# Patient Record
Sex: Female | Born: 1980
Health system: Southern US, Community
[De-identification: ages and names within clinical notes are randomized; demographics above are authoritative.]

## PROBLEM LIST (undated history)

## (undated) DIAGNOSIS — Z8619 Personal history of other infectious and parasitic diseases: Secondary | ICD-10-CM

## (undated) DIAGNOSIS — R87629 Unspecified abnormal cytological findings in specimens from vagina: Secondary | ICD-10-CM

## (undated) DIAGNOSIS — D649 Anemia, unspecified: Secondary | ICD-10-CM

## (undated) DIAGNOSIS — E559 Vitamin D deficiency, unspecified: Secondary | ICD-10-CM

## (undated) DIAGNOSIS — D509 Iron deficiency anemia, unspecified: Secondary | ICD-10-CM

## (undated) DIAGNOSIS — Z973 Presence of spectacles and contact lenses: Secondary | ICD-10-CM

## (undated) DIAGNOSIS — E538 Deficiency of other specified B group vitamins: Secondary | ICD-10-CM

## (undated) DIAGNOSIS — D219 Benign neoplasm of connective and other soft tissue, unspecified: Secondary | ICD-10-CM

---

## 2002-03-11 ENCOUNTER — Ambulatory Visit: Admission: RE | Admit: 2002-03-11 | Discharge: 2002-04-10 | Payer: Self-pay | Admitting: *Deleted

## 2005-08-27 ENCOUNTER — Other Ambulatory Visit: Admission: RE | Admit: 2005-08-27 | Discharge: 2005-08-27 | Payer: Self-pay | Admitting: Obstetrics and Gynecology

## 2011-08-06 ENCOUNTER — Ambulatory Visit (INDEPENDENT_AMBULATORY_CARE_PROVIDER_SITE_OTHER): Payer: BC Managed Care – PPO | Admitting: Obstetrics and Gynecology

## 2011-08-06 DIAGNOSIS — Z01419 Encounter for gynecological examination (general) (routine) without abnormal findings: Secondary | ICD-10-CM

## 2013-08-13 ENCOUNTER — Other Ambulatory Visit: Payer: Self-pay | Admitting: Obstetrics and Gynecology

## 2013-08-13 DIAGNOSIS — D219 Benign neoplasm of connective and other soft tissue, unspecified: Secondary | ICD-10-CM

## 2013-08-13 DIAGNOSIS — N941 Unspecified dyspareunia: Secondary | ICD-10-CM

## 2013-08-18 ENCOUNTER — Other Ambulatory Visit: Payer: Self-pay

## 2013-09-29 ENCOUNTER — Other Ambulatory Visit: Payer: Self-pay

## 2013-11-22 ENCOUNTER — Encounter (HOSPITAL_COMMUNITY): Payer: Self-pay | Admitting: Pharmacist

## 2013-11-23 ENCOUNTER — Other Ambulatory Visit: Payer: Self-pay | Admitting: Obstetrics and Gynecology

## 2013-11-29 NOTE — Patient Instructions (Addendum)
   Your procedure is scheduled on:  Friday, June 26  Enter through the [employer] of [facility] at: 9 AM Pick up the phone at the desk and dial [phone] and inform us of your arrival.  Please call this number if you have any problems the morning of surgery: [phone]  Remember: Do not eat or drink after midnight: Thursday: Take these medicines the morning of surgery with a SIP OF WATER:  None  Do not wear jewelry, make-up, or FINGER nail polish No metal in your hair or on your body. Do not wear lotions, powders, perfumes.  You may wear deodorant.  Do not bring valuables to the hospital. Contacts, dentures or bridgework may not be worn into surgery.  Leave suitcase in the car. After Surgery it may be brought to your room. For patients being admitted to the hospital, checkout time is 11:00am the day of discharge.  Home with husband Quillian Quince cell [phone].

## 2013-11-30 ENCOUNTER — Encounter (HOSPITAL_COMMUNITY): Payer: Self-pay

## 2013-11-30 ENCOUNTER — Encounter (HOSPITAL_COMMUNITY)
Admission: RE | Admit: 2013-11-30 | Discharge: 2013-11-30 | Disposition: A | Discharge status: Home / Self Care | Payer: Federal, State, Local not specified - PPO | Source: Ambulatory Visit | Attending: Obstetrics and Gynecology | Admitting: Obstetrics and Gynecology

## 2013-11-30 LAB — TYPE AND SCREEN
ABO/RH(D): O POS
Antibody Screen: NEGATIVE

## 2013-11-30 LAB — CBC
HEMATOCRIT: 40.3 % (ref 36.0–46.0)
Hemoglobin: 13.3 g/dL (ref 12.0–15.0)
MCH: 27.4 pg (ref 26.0–34.0)
MCHC: 33 g/dL (ref 30.0–36.0)
MCV: 83.1 fL (ref 78.0–100.0)
PLATELETS: 258 10*3/uL (ref 150–400)
RBC: 4.85 MIL/uL (ref 3.87–5.11)
RDW: 13.5 % (ref 11.5–15.5)
WBC: 8.9 10*3/uL (ref 4.0–10.5)

## 2013-11-30 LAB — ABO/RH: ABO/RH(D): O POS

## 2013-12-03 ENCOUNTER — Encounter (HOSPITAL_COMMUNITY): Payer: Self-pay | Admitting: *Deleted

## 2013-12-03 ENCOUNTER — Encounter (HOSPITAL_COMMUNITY)
Admission: RE | Disposition: A | Discharge status: Home / Self Care | Payer: Self-pay | Source: Ambulatory Visit | Attending: Obstetrics and Gynecology

## 2013-12-03 ENCOUNTER — Encounter (HOSPITAL_COMMUNITY): Payer: Federal, State, Local not specified - PPO | Admitting: Anesthesiology

## 2013-12-03 ENCOUNTER — Inpatient Hospital Stay (HOSPITAL_COMMUNITY): Payer: Federal, State, Local not specified - PPO | Admitting: Anesthesiology

## 2013-12-03 ENCOUNTER — Inpatient Hospital Stay (HOSPITAL_COMMUNITY)
Admission: RE | Admit: 2013-12-03 | Discharge: 2013-12-05 | DRG: 742 | Disposition: A | Discharge status: Home / Self Care | Payer: Federal, State, Local not specified - PPO | Source: Ambulatory Visit | Attending: Obstetrics and Gynecology | Admitting: Obstetrics and Gynecology

## 2013-12-03 DIAGNOSIS — D250 Submucous leiomyoma of uterus: Principal | ICD-10-CM | POA: Diagnosis present

## 2013-12-03 DIAGNOSIS — IMO0002 Reserved for concepts with insufficient information to code with codable children: Secondary | ICD-10-CM | POA: Diagnosis present

## 2013-12-03 DIAGNOSIS — N831 Corpus luteum cyst of ovary, unspecified side: Secondary | ICD-10-CM | POA: Diagnosis present

## 2013-12-03 DIAGNOSIS — D279 Benign neoplasm of unspecified ovary: Secondary | ICD-10-CM | POA: Diagnosis present

## 2013-12-03 DIAGNOSIS — D251 Intramural leiomyoma of uterus: Secondary | ICD-10-CM | POA: Diagnosis present

## 2013-12-03 DIAGNOSIS — Z9889 Other specified postprocedural states: Secondary | ICD-10-CM

## 2013-12-03 DIAGNOSIS — Y9210 Unspecified residential institution as the place of occurrence of the external cause: Secondary | ICD-10-CM | POA: Diagnosis not present

## 2013-12-03 DIAGNOSIS — Y838 Other surgical procedures as the cause of abnormal reaction of the patient, or of later complication, without mention of misadventure at the time of the procedure: Secondary | ICD-10-CM | POA: Diagnosis not present

## 2013-12-03 HISTORY — PX: MYOMECTOMY: SHX85

## 2013-12-03 LAB — PREGNANCY, URINE: Preg Test, Ur: NEGATIVE

## 2013-12-03 SURGERY — MYOMECTOMY, ABDOMINAL APPROACH
Anesthesia: General | Site: Abdomen

## 2013-12-03 MED ORDER — KETOROLAC TROMETHAMINE 30 MG/ML IJ SOLN
30.0000 mg | Freq: Four times a day (QID) | INTRAMUSCULAR | Status: DC
  Administered 2013-12-03 – 2013-12-04 (×2): 30 mg via INTRAVENOUS
  Filled 2013-12-03 (×2): qty 1

## 2013-12-03 MED ORDER — PROPOFOL 10 MG/ML IV BOLUS
INTRAVENOUS | Status: DC | PRN
  Administered 2013-12-03: 200 mg via INTRAVENOUS

## 2013-12-03 MED ORDER — PROMETHAZINE HCL 25 MG/ML IJ SOLN: 6.2500 mg | INTRAMUSCULAR | Status: DC | PRN

## 2013-12-03 MED ORDER — GLYCOPYRROLATE 0.2 MG/ML IJ SOLN
INTRAMUSCULAR | Status: DC | PRN
  Administered 2013-12-03: 0.1 mg via INTRAVENOUS
  Administered 2013-12-03: 0.4 mg via INTRAVENOUS
  Administered 2013-12-03: 0.1 mg via INTRAVENOUS

## 2013-12-03 MED ORDER — MIDAZOLAM HCL 2 MG/2ML IJ SOLN
INTRAMUSCULAR | Status: DC | PRN
  Administered 2013-12-03: 2 mg via INTRAVENOUS

## 2013-12-03 MED ORDER — NALOXONE HCL 0.4 MG/ML IJ SOLN: 0.4000 mg | INTRAMUSCULAR | Status: DC | PRN

## 2013-12-03 MED ORDER — ROCURONIUM BROMIDE 100 MG/10ML IV SOLN
INTRAVENOUS | Status: AC
  Filled 2013-12-03: qty 1

## 2013-12-03 MED ORDER — HYDROMORPHONE HCL PF 1 MG/ML IJ SOLN: 0.2000 mg | INTRAMUSCULAR | Status: DC | PRN

## 2013-12-03 MED ORDER — NEOSTIGMINE METHYLSULFATE 10 MG/10ML IV SOLN
INTRAVENOUS | Status: AC
  Filled 2013-12-03: qty 1

## 2013-12-03 MED ORDER — DEXTROSE IN LACTATED RINGERS 5 % IV SOLN
INTRAVENOUS | Status: DC
  Administered 2013-12-03: 19:00:00 via INTRAVENOUS

## 2013-12-03 MED ORDER — BUPIVACAINE HCL (PF) 0.25 % IJ SOLN
INTRAMUSCULAR | Status: DC | PRN
  Administered 2013-12-03: 8 mL

## 2013-12-03 MED ORDER — HYDROMORPHONE 0.3 MG/ML IV SOLN
INTRAVENOUS | Status: DC
  Administered 2013-12-03: 1.19 mg via INTRAVENOUS
  Administered 2013-12-03: 15:00:00 via INTRAVENOUS
  Administered 2013-12-03: 3.99 mg via INTRAVENOUS
  Administered 2013-12-04: 0.2 mg via INTRAVENOUS
  Administered 2013-12-04: 0.6 mg via INTRAVENOUS
  Filled 2013-12-03: qty 25

## 2013-12-03 MED ORDER — OXYCODONE-ACETAMINOPHEN 5-325 MG PO TABS
1.0000 | ORAL_TABLET | ORAL | Status: DC | PRN
  Administered 2013-12-04: 2 via ORAL
  Administered 2013-12-04: 1 via ORAL
  Administered 2013-12-05: 2 via ORAL
  Filled 2013-12-03 (×3): qty 2

## 2013-12-03 MED ORDER — NEOSTIGMINE METHYLSULFATE 10 MG/10ML IV SOLN
INTRAVENOUS | Status: DC | PRN
  Administered 2013-12-03: 3 mg via INTRAVENOUS

## 2013-12-03 MED ORDER — GLYCOPYRROLATE 0.2 MG/ML IJ SOLN
INTRAMUSCULAR | Status: AC
  Filled 2013-12-03: qty 3

## 2013-12-03 MED ORDER — FENTANYL CITRATE 0.05 MG/ML IJ SOLN
INTRAMUSCULAR | Status: AC
  Filled 2013-12-03: qty 2

## 2013-12-03 MED ORDER — HYDROMORPHONE HCL PF 1 MG/ML IJ SOLN
0.2500 mg | INTRAMUSCULAR | Status: DC | PRN
Start: 2013-12-03 — End: 2013-12-03
  Administered 2013-12-03: 0.5 mg via INTRAVENOUS

## 2013-12-03 MED ORDER — MENTHOL 3 MG MT LOZG: 1.0000 | LOZENGE | OROMUCOSAL | Status: DC | PRN

## 2013-12-03 MED ORDER — ONDANSETRON HCL 4 MG PO TABS
4.0000 mg | ORAL_TABLET | Freq: Four times a day (QID) | ORAL | Status: DC | PRN
  Administered 2013-12-05: 4 mg via ORAL
  Filled 2013-12-03: qty 1

## 2013-12-03 MED ORDER — KETOROLAC TROMETHAMINE 30 MG/ML IJ SOLN
INTRAMUSCULAR | Status: DC | PRN
  Administered 2013-12-03: 30 mg via INTRAVENOUS

## 2013-12-03 MED ORDER — PROPOFOL 10 MG/ML IV EMUL
INTRAVENOUS | Status: AC
  Filled 2013-12-03: qty 20

## 2013-12-03 MED ORDER — IBUPROFEN 800 MG PO TABS: 800.0000 mg | ORAL_TABLET | Freq: Three times a day (TID) | ORAL | Status: DC | PRN

## 2013-12-03 MED ORDER — DEXAMETHASONE SODIUM PHOSPHATE 10 MG/ML IJ SOLN
INTRAMUSCULAR | Status: DC | PRN
  Administered 2013-12-03: 10 mg via INTRAVENOUS

## 2013-12-03 MED ORDER — DIPHENHYDRAMINE HCL 50 MG/ML IJ SOLN: 12.5000 mg | Freq: Four times a day (QID) | INTRAMUSCULAR | Status: DC | PRN

## 2013-12-03 MED ORDER — ONDANSETRON HCL 4 MG/2ML IJ SOLN
INTRAMUSCULAR | Status: DC | PRN
  Administered 2013-12-03: 4 mg via INTRAVENOUS

## 2013-12-03 MED ORDER — METHYLENE BLUE 1 % INJ SOLN
INTRAMUSCULAR | Status: AC
  Filled 2013-12-03: qty 1

## 2013-12-03 MED ORDER — HYDROMORPHONE HCL PF 1 MG/ML IJ SOLN
INTRAMUSCULAR | Status: DC | PRN
  Administered 2013-12-03: 1 mg via INTRAVENOUS

## 2013-12-03 MED ORDER — LIDOCAINE HCL (CARDIAC) 20 MG/ML IV SOLN
INTRAVENOUS | Status: AC
  Filled 2013-12-03: qty 5

## 2013-12-03 MED ORDER — MIDAZOLAM HCL 2 MG/2ML IJ SOLN: 0.5000 mg | Freq: Once | INTRAMUSCULAR | Status: DC | PRN

## 2013-12-03 MED ORDER — HYDROMORPHONE HCL PF 1 MG/ML IJ SOLN
INTRAMUSCULAR | Status: AC
  Filled 2013-12-03: qty 1

## 2013-12-03 MED ORDER — VASOPRESSIN 20 UNIT/ML IJ SOLN
INTRAMUSCULAR | Status: AC
  Filled 2013-12-03: qty 1

## 2013-12-03 MED ORDER — SODIUM CHLORIDE 0.9 % IV SOLN
INTRAVENOUS | Status: DC | PRN
  Administered 2013-12-03: 11:00:00 via INTRAMUSCULAR

## 2013-12-03 MED ORDER — PANTOPRAZOLE SODIUM 40 MG PO TBEC
40.0000 mg | DELAYED_RELEASE_TABLET | Freq: Every day | ORAL | Status: DC
  Administered 2013-12-03: 40 mg via ORAL
  Filled 2013-12-03: qty 1

## 2013-12-03 MED ORDER — BUPIVACAINE HCL (PF) 0.25 % IJ SOLN
INTRAMUSCULAR | Status: AC
  Filled 2013-12-03: qty 30

## 2013-12-03 MED ORDER — VASOPRESSIN 20 UNIT/ML IJ SOLN
INTRAMUSCULAR | Status: AC
  Filled 2013-12-03: qty 2

## 2013-12-03 MED ORDER — KETOROLAC TROMETHAMINE 30 MG/ML IJ SOLN: 15.0000 mg | Freq: Once | INTRAMUSCULAR | Status: DC | PRN

## 2013-12-03 MED ORDER — DIPHENHYDRAMINE HCL 12.5 MG/5ML PO ELIX: 12.5000 mg | ORAL_SOLUTION | Freq: Four times a day (QID) | ORAL | Status: DC | PRN

## 2013-12-03 MED ORDER — SODIUM CHLORIDE 0.9 % IJ SOLN
INTRAMUSCULAR | Status: AC
  Filled 2013-12-03: qty 50

## 2013-12-03 MED ORDER — SODIUM CHLORIDE 0.9 % IJ SOLN
INTRAMUSCULAR | Status: AC
  Filled 2013-12-03: qty 40

## 2013-12-03 MED ORDER — LACTATED RINGERS IV SOLN
INTRAVENOUS | Status: DC
  Administered 2013-12-03 (×3): via INTRAVENOUS

## 2013-12-03 MED ORDER — ONDANSETRON HCL 4 MG/2ML IJ SOLN
4.0000 mg | Freq: Four times a day (QID) | INTRAMUSCULAR | Status: DC | PRN
  Administered 2013-12-03: 4 mg via INTRAVENOUS
  Filled 2013-12-03: qty 2

## 2013-12-03 MED ORDER — MEPERIDINE HCL 25 MG/ML IJ SOLN: 6.2500 mg | INTRAMUSCULAR | Status: DC | PRN

## 2013-12-03 MED ORDER — MIDAZOLAM HCL 2 MG/2ML IJ SOLN
INTRAMUSCULAR | Status: AC
  Filled 2013-12-03: qty 2

## 2013-12-03 MED ORDER — ROCURONIUM BROMIDE 100 MG/10ML IV SOLN
INTRAVENOUS | Status: DC | PRN
  Administered 2013-12-03: 10 mg via INTRAVENOUS
  Administered 2013-12-03: 40 mg via INTRAVENOUS

## 2013-12-03 MED ORDER — SODIUM CHLORIDE 0.9 % IJ SOLN: 9.0000 mL | INTRAMUSCULAR | Status: DC | PRN

## 2013-12-03 MED ORDER — ONDANSETRON HCL 4 MG/2ML IJ SOLN: 4.0000 mg | Freq: Four times a day (QID) | INTRAMUSCULAR | Status: DC | PRN

## 2013-12-03 MED ORDER — FENTANYL CITRATE 0.05 MG/ML IJ SOLN
INTRAMUSCULAR | Status: DC | PRN
  Administered 2013-12-03: 50 ug via INTRAVENOUS
  Administered 2013-12-03 (×2): 100 ug via INTRAVENOUS

## 2013-12-03 MED ORDER — CEFAZOLIN SODIUM-DEXTROSE 2-3 GM-% IV SOLR
2.0000 g | INTRAVENOUS | Status: AC
  Administered 2013-12-03: 2 g via INTRAVENOUS

## 2013-12-03 MED ORDER — CEFAZOLIN SODIUM-DEXTROSE 2-3 GM-% IV SOLR
INTRAVENOUS | Status: AC
  Filled 2013-12-03: qty 50

## 2013-12-03 MED ORDER — KETOROLAC TROMETHAMINE 30 MG/ML IJ SOLN
INTRAMUSCULAR | Status: AC
  Filled 2013-12-03: qty 1

## 2013-12-03 MED ORDER — LIDOCAINE HCL (CARDIAC) 20 MG/ML IV SOLN
INTRAVENOUS | Status: DC | PRN
  Administered 2013-12-03: 80 mg via INTRAVENOUS

## 2013-12-03 MED ORDER — ONDANSETRON HCL 4 MG/2ML IJ SOLN
INTRAMUSCULAR | Status: AC
  Filled 2013-12-03: qty 2

## 2013-12-03 MED ORDER — 0.9 % SODIUM CHLORIDE (POUR BTL) OPTIME
TOPICAL | Status: DC | PRN
  Administered 2013-12-03: 1000 mL

## 2013-12-03 MED ORDER — FENTANYL CITRATE 0.05 MG/ML IJ SOLN
INTRAMUSCULAR | Status: AC
  Filled 2013-12-03: qty 5

## 2013-12-03 MED ORDER — DEXAMETHASONE SODIUM PHOSPHATE 10 MG/ML IJ SOLN
INTRAMUSCULAR | Status: AC
  Filled 2013-12-03: qty 1

## 2013-12-03 MED ORDER — KETOROLAC TROMETHAMINE 30 MG/ML IJ SOLN: 30.0000 mg | Freq: Four times a day (QID) | INTRAMUSCULAR | Status: DC

## 2013-12-03 SURGICAL SUPPLY — 48 items
BARRIER ADHS 3X4 INTERCEED (GAUZE/BANDAGES/DRESSINGS) ×2 IMPLANT
BRR ADH 4X3 ABS CNTRL BYND (GAUZE/BANDAGES/DRESSINGS) ×2
CANISTER SUCT 3000ML (MISCELLANEOUS) ×3 IMPLANT
CATH FOLEY 2WAY  3CC  8FR (CATHETERS)
CATH FOLEY 2WAY 3CC 8FR (CATHETERS) IMPLANT
CATH FOLEY LATEX FREE 14FR (CATHETERS) ×3
CATH FOLEY LF 14FR (CATHETERS) ×1 IMPLANT
CLOTH BEACON ORANGE TIMEOUT ST (SAFETY) ×3 IMPLANT
CONT PATH 16OZ SNAP LID 3702 (MISCELLANEOUS) ×1 IMPLANT
CONT SPEC PATH 64OZ SNAP LID (MISCELLANEOUS) ×3 IMPLANT
DECANTER SPIKE VIAL GLASS SM (MISCELLANEOUS) ×3 IMPLANT
DRAPE CESAREAN BIRTH W POUCH (DRAPES) ×3 IMPLANT
DRSG OPSITE POSTOP 4X10 (GAUZE/BANDAGES/DRESSINGS) ×2 IMPLANT
ELECT NDL TIP 2.8 STRL (NEEDLE) ×1 IMPLANT
ELECT NEEDLE TIP 2.8 STRL (NEEDLE) ×3 IMPLANT
FILTER STRAW FLUID ASPIR (MISCELLANEOUS) IMPLANT
GAUZE SPONGE 4X4 16PLY XRAY LF (GAUZE/BANDAGES/DRESSINGS) ×3 IMPLANT
GLOVE BIOGEL PI IND STRL 7.0 (GLOVE) ×4 IMPLANT
GLOVE BIOGEL PI INDICATOR 7.0 (GLOVE) ×3
GLOVE ECLIPSE 6.5 STRL STRAW (GLOVE) ×3 IMPLANT
GOWN STRL REUS W/TWL LRG LVL3 (GOWN DISPOSABLE) ×15 IMPLANT
IV STOPCOCK 4 WAY 40  W/Y SET (IV SOLUTION)
IV STOPCOCK 4 WAY 40 W/Y SET (IV SOLUTION) IMPLANT
NDL HYPO 25X1 1.5 SAFETY (NEEDLE) ×1 IMPLANT
NEEDLE HYPO 25X1 1.5 SAFETY (NEEDLE) ×3 IMPLANT
NEEDLE SPNL 22GX9.0 ACCUTG (NEEDLE) IMPLANT
NS IRRIG 1000ML POUR BTL (IV SOLUTION) ×5 IMPLANT
PACK ABDOMINAL GYN (CUSTOM PROCEDURE TRAY) ×3 IMPLANT
PAD OB MATERNITY 4.3X12.25 (PERSONAL CARE ITEMS) ×3 IMPLANT
SPONGE GAUZE 4X4 12PLY STER LF (GAUZE/BANDAGES/DRESSINGS) ×3 IMPLANT
STAPLER VISISTAT 35W (STAPLE) ×2 IMPLANT
SUT MNCRL AB 4-0 PS2 18 (SUTURE) ×6 IMPLANT
SUT MON AB 3-0 SH 27 (SUTURE) ×12
SUT MON AB 3-0 SH27 (SUTURE) ×4 IMPLANT
SUT PLAIN 2 0 XLH (SUTURE) ×2 IMPLANT
SUT VIC AB 0 CT1 18XCR BRD8 (SUTURE) ×5 IMPLANT
SUT VIC AB 0 CT1 36 (SUTURE) ×6 IMPLANT
SUT VIC AB 0 CT1 8-18 (SUTURE) ×6
SUT VIC AB 2-0 CT1 27 (SUTURE) ×6
SUT VIC AB 2-0 CT1 TAPERPNT 27 (SUTURE) ×3 IMPLANT
SUT VIC AB 2-0 SH 27 (SUTURE) ×6
SUT VIC AB 2-0 SH 27XBRD (SUTURE) ×6 IMPLANT
SUT VIC AB 4-0 PS2 27 (SUTURE) ×2 IMPLANT
SYR 50ML LL SCALE MARK (SYRINGE) ×4 IMPLANT
SYR CONTROL 10ML LL (SYRINGE) ×5 IMPLANT
TOWEL OR 17X24 6PK STRL BLUE (TOWEL DISPOSABLE) ×6 IMPLANT
TRAY FOLEY CATH 14FR (SET/KITS/TRAYS/PACK) ×1 IMPLANT
WATER STERILE IRR 1000ML POUR (IV SOLUTION) ×1 IMPLANT

## 2013-12-03 NOTE — Brief Op Note (Signed)
12/03/2013  12:40 PM  PATIENT:  Melanie [name]  33 y.o. female  PRE-OPERATIVE DIAGNOSIS:  Fibroid Uterus  POST-OPERATIVE DIAGNOSIS:  Fibroid Uterus, left ovarian adenofibroma  PROCEDURE:  Procedure(s): Exploratory Laparotomy MYOMECTOMY (N/A), excision of left ovarian adenofibroma  SURGEON:  Surgeon(s) and Role:    * Sheronette A Cousins, MD - Primary  PHYSICIAN ASSISTANT:   ASSISTANTS: Artelia Laroche, CNM   ANESTHESIA:   general FINDINGS: Small pea size ovarian lesion, left ovarian cyst, nl right ovary, large pedunculated torsed fibroid( left), small right multilobed 4-5 cm pedunculated fibroid, SM fibroid, IM fibroid, endometrial cavity entered EBL:  Total I/O In: 2000 [I.V.:2000] Out: 300 [Urine:250; Blood:50]  BLOOD ADMINISTERED:none  DRAINS: none   LOCAL MEDICATIONS USED:  MARCAINE     SPECIMEN:  Source of Specimen:  myomas x 5, left ovarian lesion, peritoneum ? endometriosis  DISPOSITION OF SPECIMEN:  PATHOLOGY  COUNTS:  YES  TOURNIQUET:  * No tourniquets in log *  DICTATION: .Other Dictation: Dictation Number [id number]  PLAN OF CARE: Admit to inpatient   PATIENT DISPOSITION:  PACU - hemodynamically stable.   Delay start of Pharmacological VTE agent (>24hrs) due to surgical blood loss or risk of bleeding: no

## 2013-12-03 NOTE — Anesthesia Preprocedure Evaluation (Addendum)
Anesthesia Evaluation  Patient identified by MRN, date of birth, ID band Patient awake    Reviewed: Allergy & Precautions, H&P , Patient's Chart, lab work & pertinent test results, reviewed documented beta blocker date and time   History of Anesthesia Complications Negative for: history of anesthetic complications  Airway Mallampati: II  TM Distance: >3 FB Neck ROM: full    Dental   Pulmonary  breath sounds clear to auscultation        Cardiovascular Exercise Tolerance: Good Rhythm:regular Rate:Normal     Neuro/Psych    GI/Hepatic   Endo/Other    Renal/GU      Musculoskeletal   Abdominal   Peds  Hematology   Anesthesia Other Findings   Reproductive/Obstetrics                             Anesthesia Physical Anesthesia Plan  ASA: II  Anesthesia Plan: General ETT   Post-op Pain Management:    Induction:   Airway Management Planned:   Additional Equipment:   Intra-op Plan:   Post-operative Plan:   Informed Consent: I have reviewed the patients History and Physical, chart, labs and discussed the procedure including the risks, benefits and alternatives for the proposed anesthesia with the patient or authorized representative who has indicated his/her understanding and acceptance.   Dental Advisory Given  Plan Discussed with: CRNA and Surgeon  Anesthesia Plan Comments:         Anesthesia Quick Evaluation  

## 2013-12-03 NOTE — Transfer of Care (Signed)
Immediate Anesthesia Transfer of Care Note  Patient: Melanie [name]  Procedure(s) Performed: Procedure(s): Exploratory Laparotomy MYOMECTOMY (N/A)  Patient Location: PACU  Anesthesia Type:General  Level of Consciousness: awake, alert  and oriented  Airway & Oxygen Therapy: Patient Spontanous Breathing and Patient connected to nasal cannula oxygen  Post-op Assessment: Report given to PACU RN, Post -op Vital signs reviewed and stable and Patient moving all extremities  Post vital signs: Reviewed and stable  Complications: No apparent anesthesia complications

## 2013-12-04 ENCOUNTER — Encounter (HOSPITAL_COMMUNITY): Payer: Federal, State, Local not specified - PPO | Admitting: Anesthesiology

## 2013-12-04 ENCOUNTER — Encounter (HOSPITAL_COMMUNITY)
Admission: RE | Disposition: A | Discharge status: Home / Self Care | Payer: Self-pay | Source: Ambulatory Visit | Attending: Obstetrics and Gynecology

## 2013-12-04 ENCOUNTER — Inpatient Hospital Stay (HOSPITAL_COMMUNITY): Payer: Federal, State, Local not specified - PPO | Admitting: Anesthesiology

## 2013-12-04 HISTORY — PX: LAPAROTOMY: SHX154

## 2013-12-04 LAB — CBC
HCT: 30.2 % — ABNORMAL LOW (ref 36.0–46.0)
Hemoglobin: 10.3 g/dL — ABNORMAL LOW (ref 12.0–15.0)
MCH: 27.8 pg (ref 26.0–34.0)
MCHC: 34.1 g/dL (ref 30.0–36.0)
MCV: 81.6 fL (ref 78.0–100.0)
PLATELETS: 243 10*3/uL (ref 150–400)
RBC: 3.7 MIL/uL — AB (ref 3.87–5.11)
RDW: 13.1 % (ref 11.5–15.5)
WBC: 13.3 10*3/uL — AB (ref 4.0–10.5)

## 2013-12-04 SURGERY — LAPAROTOMY, EXPLORATORY
Anesthesia: Spinal | Site: Abdomen

## 2013-12-04 MED ORDER — LACTATED RINGERS IV SOLN
INTRAVENOUS | Status: DC | PRN
  Administered 2013-12-04 (×2): via INTRAVENOUS

## 2013-12-04 MED ORDER — FENTANYL CITRATE 0.05 MG/ML IJ SOLN
INTRAMUSCULAR | Status: AC
  Filled 2013-12-04: qty 5

## 2013-12-04 MED ORDER — NEOSTIGMINE METHYLSULFATE 10 MG/10ML IV SOLN
INTRAVENOUS | Status: AC
  Filled 2013-12-04: qty 1

## 2013-12-04 MED ORDER — LIDOCAINE HCL (CARDIAC) 20 MG/ML IV SOLN
INTRAVENOUS | Status: DC | PRN
  Administered 2013-12-04: 20 mg via INTRAVENOUS

## 2013-12-04 MED ORDER — MIDAZOLAM HCL 2 MG/2ML IJ SOLN
INTRAMUSCULAR | Status: AC
  Filled 2013-12-04: qty 2

## 2013-12-04 MED ORDER — LIDOCAINE IN DEXTROSE 5-7.5 % IV SOLN: INTRAVENOUS | Status: DC | PRN

## 2013-12-04 MED ORDER — FENTANYL CITRATE 0.05 MG/ML IJ SOLN: 25.0000 ug | INTRAMUSCULAR | Status: DC | PRN

## 2013-12-04 MED ORDER — PROPOFOL 10 MG/ML IV EMUL
INTRAVENOUS | Status: AC
  Filled 2013-12-04: qty 20

## 2013-12-04 MED ORDER — MIDAZOLAM HCL 2 MG/2ML IJ SOLN
INTRAMUSCULAR | Status: DC | PRN
  Administered 2013-12-04: 2 mg via INTRAVENOUS

## 2013-12-04 MED ORDER — ROCURONIUM BROMIDE 100 MG/10ML IV SOLN
INTRAVENOUS | Status: AC
  Filled 2013-12-04: qty 1

## 2013-12-04 MED ORDER — ONDANSETRON HCL 4 MG/2ML IJ SOLN
INTRAMUSCULAR | Status: DC | PRN
  Administered 2013-12-04: 4 mg via INTRAVENOUS

## 2013-12-04 MED ORDER — GLYCOPYRROLATE 0.2 MG/ML IJ SOLN
INTRAMUSCULAR | Status: AC
  Filled 2013-12-04: qty 3

## 2013-12-04 MED ORDER — PROPOFOL INFUSION 10 MG/ML OPTIME
INTRAVENOUS | Status: DC | PRN
  Administered 2013-12-04: 75 ug/kg/min via INTRAVENOUS

## 2013-12-04 MED ORDER — ONDANSETRON HCL 4 MG/2ML IJ SOLN
INTRAMUSCULAR | Status: AC
  Filled 2013-12-04: qty 2

## 2013-12-04 MED ORDER — SODIUM CHLORIDE 0.9 % IR SOLN
Freq: Once | Status: DC
  Filled 2013-12-04: qty 1

## 2013-12-04 MED ORDER — KETOROLAC TROMETHAMINE 30 MG/ML IJ SOLN
INTRAMUSCULAR | Status: AC
  Filled 2013-12-04: qty 1

## 2013-12-04 MED ORDER — POLYMYXIN B SULFATE 500000 UNITS IJ SOLR
INTRAMUSCULAR | Status: DC | PRN
  Administered 2013-12-04: 02:00:00

## 2013-12-04 MED ORDER — LIDOCAINE HCL (CARDIAC) 20 MG/ML IV SOLN
INTRAVENOUS | Status: AC
  Filled 2013-12-04: qty 5

## 2013-12-04 MED ORDER — DEXAMETHASONE SODIUM PHOSPHATE 10 MG/ML IJ SOLN
INTRAMUSCULAR | Status: AC
  Filled 2013-12-04: qty 1

## 2013-12-04 SURGICAL SUPPLY — 31 items
APL SKNCLS STERI-STRIP NONHPOA (GAUZE/BANDAGES/DRESSINGS)
BENZOIN TINCTURE PRP APPL 2/3 (GAUZE/BANDAGES/DRESSINGS) ×1 IMPLANT
CANISTER SUCT 3000ML (MISCELLANEOUS) ×2 IMPLANT
CLOTH BEACON ORANGE TIMEOUT ST (SAFETY) ×2 IMPLANT
CONT PATH 16OZ SNAP LID 3702 (MISCELLANEOUS) ×1 IMPLANT
DECANTER SPIKE VIAL GLASS SM (MISCELLANEOUS) IMPLANT
DRAPE WARM FLUID 44X44 (DRAPE) IMPLANT
DRSG OPSITE POSTOP 4X10 (GAUZE/BANDAGES/DRESSINGS) ×1 IMPLANT
GAUZE SPONGE 4X4 16PLY XRAY LF (GAUZE/BANDAGES/DRESSINGS) ×1 IMPLANT
GLOVE BIOGEL PI IND STRL 7.0 (GLOVE) ×1 IMPLANT
GLOVE BIOGEL PI INDICATOR 7.0 (GLOVE) ×1
GLOVE ECLIPSE 6.5 STRL STRAW (GLOVE) ×1 IMPLANT
GOWN STRL REUS W/TWL LRG LVL3 (GOWN DISPOSABLE) ×6 IMPLANT
NDL HYPO 25X1 1.5 SAFETY (NEEDLE) IMPLANT
NEEDLE HYPO 25X1 1.5 SAFETY (NEEDLE) IMPLANT
NS IRRIG 1000ML POUR BTL (IV SOLUTION) ×2 IMPLANT
PACK ABDOMINAL GYN (CUSTOM PROCEDURE TRAY) ×2 IMPLANT
PAD ABD 8X7 1/2 STERILE (GAUZE/BANDAGES/DRESSINGS) ×1 IMPLANT
PAD OB MATERNITY 4.3X12.25 (PERSONAL CARE ITEMS) ×2 IMPLANT
SPONGE LAP 18X18 X RAY DECT (DISPOSABLE) ×3 IMPLANT
STAPLER VISISTAT 35W (STAPLE) ×2 IMPLANT
STRIP CLOSURE SKIN 1/2X4 (GAUZE/BANDAGES/DRESSINGS) ×1 IMPLANT
SUT PLAIN 2 0 XLH (SUTURE) ×3 IMPLANT
SUT PROLENE 0 CT 1 30 (SUTURE) IMPLANT
SUT VIC AB 0 CT1 18XCR BRD8 (SUTURE) IMPLANT
SUT VIC AB 0 CT1 36 (SUTURE) ×4 IMPLANT
SUT VIC AB 0 CT1 8-18 (SUTURE)
SYR CONTROL 10ML LL (SYRINGE) IMPLANT
TOWEL OR 17X24 6PK STRL BLUE (TOWEL DISPOSABLE) ×4 IMPLANT
TRAY FOLEY CATH 14FR (SET/KITS/TRAYS/PACK) ×1 IMPLANT
WATER STERILE IRR 1000ML POUR (IV SOLUTION) ×1 IMPLANT

## 2013-12-04 NOTE — Progress Notes (Addendum)
Pt c/o new onset of pain in abdomen at this time. Upon assessment, firm area noted to right, just above incision. Scant amount of serosanguinous fluid noted on honeycomb dressing. Vital signs WNL. MD on call, Dr. Garwin Brothers notified at this time for further evaluation.

## 2013-12-04 NOTE — Anesthesia Postprocedure Evaluation (Signed)
  Anesthesia Post-op Note  Patient: Melanie [name]  Procedure(s) Performed: Procedure(s): Exploratory Laparotomy with Evacuation Hematoma  (N/A)  Patient is awake, responsive, moving her legs, and has signs of resolution of her numbness. Pain and nausea are reasonably well controlled. Vital signs are stable and clinically acceptable. Oxygen saturation is clinically acceptable. There are no apparent anesthetic complications at this time. Patient is ready for discharge.

## 2013-12-04 NOTE — Brief Op Note (Signed)
12/03/2013 - 12/04/2013  2:30 AM  PATIENT:  Melanie [name]  33 y.o. female  PRE-OPERATIVE DIAGNOSIS:  Incisional/wound Hematoma , s/p myomectomy  POST-OPERATIVE DIAGNOSIS: Incisional/wound Hematoma, s/p myomectomy  PROCEDURE:  Procedure(s): Exploratory Laparotomy with Evacuation Hematoma  (N/A)  SURGEON:  Surgeon(s) and Role:    * Sheronette A Cousins, MD - Primary  PHYSICIAN ASSISTANT:   ASSISTANTS: none   ANESTHESIA:   spinal Finding: active clot( bright red) in middle of incision with overall wound with blood clots. Fascia intact EBL:  Total I/O In: 1779.1 [P.O.:240; I.V.:1539.1] Out: 950 [Urine:600; Emesis/NG output:300; Blood:50]  BLOOD ADMINISTERED:none  DRAINS: none   LOCAL MEDICATIONS USED:  NONE  SPECIMEN:  No Specimen  DISPOSITION OF SPECIMEN:  N/A  COUNTS:  YES  TOURNIQUET:  * No tourniquets in log *  DICTATION: .Other Dictation: Dictation Number [id number]  PLAN OF CARE: Admit to inpatient   PATIENT DISPOSITION:  PACU - hemodynamically stable.   Delay start of Pharmacological VTE agent (>24hrs) due to surgical blood loss or risk of bleeding: no

## 2013-12-04 NOTE — Anesthesia Postprocedure Evaluation (Signed)
  Anesthesia Post-op Note  Patient: Melanie [name]  Procedure(s) Performed: Procedure(s): Exploratory Laparotomy MYOMECTOMY (N/A) Patient is awake and responsive. Pain and nausea are reasonably well controlled. Vital signs are stable and clinically acceptable. Oxygen saturation is clinically acceptable. There are no apparent anesthetic complications at this time. Patient is ready for discharge.

## 2013-12-04 NOTE — Anesthesia Preprocedure Evaluation (Signed)

## 2013-12-04 NOTE — Progress Notes (Signed)
Subjective: Patient reports tolerating PO.  Foley out. Incision feels better Feels well s/p hematoma Objective: I have reviewed patient's vital signs.  intake and output and labs. Filed Vitals:   12/04/13 0845  BP: 109/55  Pulse: 74  Temp: 98.7 F (37.1 C)  Resp: 16   I/O last 3 completed shifts: In: 5000.1 [P.O.:360; I.V.:4640.1] Out: 2925 [Urine:2525; Emesis/NG output:300; Blood:100] Total I/O In: -  Out: 700 [Urine:700]  Lab Results  Component Value Date   WBC 13.3* 12/04/2013   HGB 10.3* 12/04/2013   HCT 30.2* 12/04/2013   MCV 81.6 12/04/2013   PLT 243 12/04/2013   No results found for this basename: CREATININE    EXAM General: alert, cooperative and no distress Resp: clear to auscultation bilaterally Cardio: regular rate and rhythm, S1, S2 normal, no murmur, click, rub or gallop GI: soft, non-tender; bowel sounds normal; no masses,  no organomegaly and incision: honeycomb dressing in place. sl stain noted. pressure dressing pulled off Extremities: no edema, redness or tenderness in the calves or thighs Vaginal Bleeding: none  Assessment: s/p Procedure(s):S/P myomectomy POD #1 Exploratory Laparotomy with Evacuation Hematoma POD #0 : stable, progressing well, tolerating diet and anemia Due to ABL Plan: Advance diet Encourage ambulation Advance to PO medication Discontinue IV fluids  LOS: 1 day    COUSINS,SHERONETTE A, MD 12/04/2013 11:54 AM    12/04/2013, 11:54 AM

## 2013-12-04 NOTE — Transfer of Care (Signed)
Immediate Anesthesia Transfer of Care Note  Patient: Melanie [name]  Procedure(s) Performed: Procedure(s): Exploratory Laparotomy with Evacuation Hematoma  (N/A)  Patient Location: PACU  Anesthesia Type:Spinal  Level of Consciousness: awake, alert  and oriented  Airway & Oxygen Therapy: Patient Spontanous Breathing  Post-op Assessment: Report given to PACU RN and Post -op Vital signs reviewed and stable  Post vital signs: Reviewed and stable  Complications: No apparent anesthesia complications

## 2013-12-04 NOTE — Anesthesia Procedure Notes (Signed)
Spinal  Patient location during procedure: OR Preanesthetic Checklist Completed: patient identified, site marked, surgical consent, pre-op evaluation, timeout performed, IV checked, risks and benefits discussed and monitors and equipment checked Spinal Block Patient position: sitting Prep: DuraPrep Patient monitoring: heart rate, cardiac monitor, continuous pulse ox and blood pressure Approach: midline Location: L3-4 Injection technique: single-shot Needle Needle type: Sprotte  Needle gauge: 24 G Needle length: 9 cm Assessment Sensory level: T8 Additional Notes Xylocaine 5% 1.6cc

## 2013-12-04 NOTE — Progress Notes (Signed)
Called by RN @ 11:45 pm regarding incision. Pt c/o increased pain on right side of incision with associated area on the right more raised than on the left and hard. Pain prior to this was a 2. Pt had several episode of vomiting prior to noting this issue  O: Abdomen: incision noted with honeycomb dressing. No bleeding noted but firm tender side on the right noted with palpation. Dressing removed and incision notable for staples taut.  Right side of incision prepped with alcohol preps and sterile Q tip used to gently probe site. Fascia intact but continued bright red blood noted filling several 4 x 4 gauzes. The opposite side was similarly explored and was without any drainage. Given the continued oozing, decision made to take pt back to OR for exploration and evacuation of hematoma. Consent obtained. Pt advised I did not feel or think pressure alone would be sufficient to manage sx given constant trickle. Agree with plan. OR notified

## 2013-12-04 NOTE — Op Note (Signed)
NAMECRYSTALE, Melanie [name] NO.:  [id number]  MEDICAL RECORD NO.:  [id number]  LOCATION:  9303                          FACILITY:  [facility]  PHYSICIAN:  Servando Salina, M.D.DATE OF BIRTH:  1981/04/29  DATE OF PROCEDURE:  12/03/2013 DATE OF DISCHARGE:                              OPERATIVE REPORT   PREOPERATIVE DIAGNOSIS:  Fibroid uterus, dyspareunia.  PROCEDURE:  Exploratory laparotomy, myomectomy, excision of left ovarian lesion.  POSTOPERATIVE DIAGNOSIS:  Pedunculated/submucosal/intramural fibroids, left ovarian adenofibroma by frozen section.  SURGEON:  Servando Salina, M.D.  ASSISTANT:  Artelia Laroche, C.N.M.  DESCRIPTION OF PROCEDURE:  Under adequate general anesthesia, the patient was placed in the supine position.  Examination under anesthesia revealed the uterus that was extended to the patient's umbilicus and mobile.  She was sterilely prepped and draped in usual fashion. Indwelling Foley catheter was sterilely placed.  A 0.25% Marcaine was injected along the planned Pfannenstiel skin incision site. Pfannenstiel skin incision was then made, carried down to the rectus fascia.  Rectus fascia was opened transversely.  Rectus fascia was then bluntly and sharply dissected off the rectus muscle in superior and inferior fashion.  The rectus muscle was split in the midline.  The parietal peritoneum was entered sharply and extended.  Exploration of the upper abdomen was notable for normal liver edge and normal palpable kidneys.  On entering the abdominal cavity, it was noted that there was a large fibroid mass involving the entire lower abdomen.  On further inspection of the pelvis, it was noted this fibroid was actually pedunculated and had rotated 360 degrees on its stalk.  A dilute solution of Pitressin was injected overlying the fibroid prior to grasping it and exteriorizing the entire mass along with the uterus. The uterus itself was smaller,6-7 weeks size  with  a palpable posterior intramural fibroid and another multi-lobed right pedunculated fundal fibroid.  The larger fibroid ,which was once untwisted, arose from the left fundal region with the left fallopian tube, proximal portion pulled up into the base of that fibroid.  There was cyst on the left ovary, suggestive of a corpus luteal cyst.  The right ovary was normal.  Both fallopian tubes distally otherwise were normal.  No evidence of endometriosis noted.  A dilute solution of Pitressin was injected along the base of the larger pedunculated fibroid.  Figure-of- eight sutures was used to secure the large blood vessels that was coursing through and feeding the base of that pedunculated fibroid to decrease bleeding.  Once that was done, the approach on that pedunculated fibroid, due to the closeness of the left fallopian tube, was from the right side and carrying the incision higher on the uterine fibroid side in order to avoid the cornual portion of the fallopian tube.  With doing so, the defect in the base of the fibroid was encountered which resulted in easier facilitation of enucleation of the fibroid itself.  Once the fibroid was removed, the base was closed with deep layer closure of 0 Vicryl figure-of-eight sutures to the serosal surface and then carried up to the serosal surface which was then closed with 3-0 Monocryl baseball sutures. No tension or involvement of the cornual portion of left fallopian  tube was noted in the closure.  Attention was then turned to the other fibroid which also was injected at the base circumferentially, opened with cautery and the fibroid was again closed with 0 Vicryl figure-of-eight sutures, carried to the serosal surface which was then closed with 3-0 Monocryl suture. Smaller fibroids were then removed.  The serosal surface of posterior fibroid was injected with dilute Pitressin. A small vertical incision was then made over the fibroid and it was  then removed.  The posterior fibroid that was removed was abutting the endometrium.  The endometrium was subsequently opened with removal of two submucosal fibroids.  Once all palpable fibroids had been removed, the defect was closed with carefully overlying that endometrium, taking care not to bring any of the endometrial tissue into the incision.  The inspection again was then notable on the left ovary where there was a pea-sized hard cauliflower-like lesion. It was excised and frozen section was requested which pathology reported it as adenofibroma.  The cyst did not look worrisome on the left, had been described as a 2-cm cyst and therefore was not removed from the left ovary.  The pelvis was copiously irrigated and suctioned.  No endometriosis was noted.  Interceed was placed on the anterior and posterior incisions of the uterus and the uterus was  Placed back to the abdomen.  In the process of closing the parietal peritoneum, there were a scarred satellite lesions, suggestive of endometriosis which was then excised and sent separately.  The parietal peritoneum was then closed with 2-0 Vicryl.  The rectus fascia was closed with 0 Vicryl x2.  The subcutaneous area was irrigated, small bleeders cauterized. Interrupted 2-0 plain sutures placed and the skin approximated using Ethicon staples.  SPECIMEN:  Myomas x 5, peritoneal tissue, ruled out endometriosis and left ovarian tissue  ESTIMATED BLOOD LOSS:  50.  INTRAOPERATIVE FLUID:  2 L.  URINE OUTPUT:  250 mL clear yellow urine.  COUNTS:  Sponge and instrument counts x2 was correct.  COMPLICATIONS:  None.  The patient tolerated the procedure well and was transferred to the recovery room in stable condition.     Servando Salina, M.D.     /MEDQ  D:  12/03/2013  T:  12/04/2013  Job:  [id number]

## 2013-12-04 NOTE — Op Note (Signed)
Melanie [name], Melanie [name] NO.:  [id number]  MEDICAL RECORD NO.:  [id number]  LOCATION:  9303                          FACILITY:  [facility]  PHYSICIAN:  Servando Salina, M.D.DATE OF BIRTH:  Jun 01, 1981  DATE OF PROCEDURE:  12/04/2013 DATE OF DISCHARGE:                              OPERATIVE REPORT   PREOPERATIVE DIAGNOSIS:  Incisional/wound hematoma.  PROCEDURE:  Evacuation of wound hematoma.  POSTOPERATIVE DIAGNOSIS:  Incisional/wound hematoma.  ANESTHESIA:  Spinal.  SURGEON:  Servando Salina, MD  ASSISTANT:  None.  INDICATION:  This is a 33 year old G52, married black female, who had underwent an uncomplicated exploratory laparotomy with myomectomy earlier on December 03, 2013, who noted acute onset of worsening pain of her incision particularly on the right side with a disproportionate height on the right side of the abdomen.  On inspection, the right side of the incision extending above the intact dressing was higher than the left. The dressing was removed and the staples were noted to be taut. No ecchymosis noted. The incision was prepped with alcohol wipes and probed with sterile Q tip. The fascia appeared intact. Continued oozing of blood from the incision with probing was noted. Given this finding, a decision was made to take the patient back to the operating room. Consent signed.   DESCRIPTION OF PROCEDURE:  Under adequate spinal anesthesia, the patient was placed in the supine position.  She was prepped with DuraPrep. Foley catheter was already in place.  She was draped in the usual sterile fashion.  The staples were then carefully removed and incision just spontaneously opened with a large amount of clotted blood noted particularly in the midline. The plain sutures were removed.   The incision was debrided of the clotted material, copiously irrigated with bug juice. With removal of any other dark clotted blood and any exudate, small bleeders were cauterized. The  fascia was inspected and was intact.  The incision was continued to be irrigated and suctioned.  Once good hemostasis appeared to have been achieved, 2-0 plain interrupted sutures was again placed to close the subcutaneous space which was about an inch and a quarter in depth. The skin was then approximated again with Ethicon staples.  At the end of the procedure, there was no ecchymosis noted surrounding the incision.  The incision was once again even and flat.  At this point, the procedure was felt to be complete. The patient was then transferred to recovery room in stable condition. Specimens was none.  Estimated blood loss: 150 mL.  Complication: none.  The patient tolerated procedure well, was transferred to recovery room in stable condition.     Servando Salina, M.D.     [place]/MEDQ  D:  12/04/2013  T:  12/04/2013  Job:  [id number]

## 2013-12-05 MED ORDER — ONDANSETRON HCL 8 MG PO TABS: 8.0000 mg | ORAL_TABLET | Freq: Two times a day (BID) | ORAL | Status: DC

## 2013-12-05 MED ORDER — OXYCODONE-ACETAMINOPHEN 5-325 MG PO TABS: 1.0000 | ORAL_TABLET | ORAL | Status: DC | PRN

## 2013-12-05 MED ORDER — IBUPROFEN 800 MG PO TABS: 800.0000 mg | ORAL_TABLET | Freq: Three times a day (TID) | ORAL | Status: DC | PRN

## 2013-12-05 NOTE — Progress Notes (Signed)
Subjective: Patient reports tolerating PO, + flatus and no problems voiding.    Objective: I have reviewed patient's vital signs.  vital signs, intake and output, medications and labs. Filed Vitals:   12/05/13 0534  BP: 121/57  Pulse: 81  Temp: 97.7 F (36.5 C)  Resp: 18   I/O last 3 completed shifts: In: 2500.1 [P.O.:360; I.V.:2140.1] Out: 3050 [Urine:2700; Emesis/NG output:300; Blood:50]    Lab Results  Component Value Date   WBC 13.3* 12/04/2013   HGB 10.3* 12/04/2013   HCT 30.2* 12/04/2013   MCV 81.6 12/04/2013   PLT 243 12/04/2013   No results found for this basename: CREATININE    EXAM General: alert, cooperative and no distress Resp: clear to auscultation bilaterally Cardio: regular rate and rhythm, S1, S2 normal, no murmur, click, rub or gallop GI: soft, non-tender; bowel sounds normal; no masses,  no organomegaly and incision: intact and pressure dressing removed . honeycomb coverage noted with 2 spots of dried blood stain Extremities: no edema, redness or tenderness in the calves or thighs Vaginal Bleeding: minimal  Assessment: s/p Procedure(s):S/P exploratory laparotomy, multiple myomectomy( POD #2) Exploratory Laparotomy with Evacuation Incisional/Wound Hematoma(POD#1) : stable, progressing well, tolerating diet and anemia Anemia 2nd to ABL Plan: Encourage ambulation Discontinue IV fluids Discharge home D/c instructions reviewed.  Staple removal /dressing Fri 7/3  LOS: 2 days    COUSINS,SHERONETTE A, MD 12/05/2013 10:52 AM    12/05/2013, 10:52 AM

## 2013-12-05 NOTE — Progress Notes (Signed)
Discharge instructions reviewed with patient and significant other.  Both state understanding of home care, medications, activity, signs/symptoms to report to MD and return MD office visit.  No home equipment needed and significant other will assist with care at home.  Patient ambulated for discharge in stable condition with staff without incident.

## 2013-12-05 NOTE — Discharge Summary (Signed)
Physician Discharge Summary  Patient ID: EULALIA ELLERMAN MRN: [id number] DOB/AGE: [birth year] 33 y.o.  Admit date: 12/03/2013 Discharge date: 12/05/2013  Admission Diagnoses: fibroid uterus, dyspareunia  Discharge Diagnoses: pedunculated?IM/SS/SM fibroid, left ovarian lesion,  S/p wound hematoma, dyspareunia  Active Problems:   S/P myomectomy s/p wound hematoma evacuation  Discharged Condition: stable  Hospital Course: Pt was admitted to [facility] where she underwent exploratory laparotomy, multiple myomectomy . See Op notes for details. Pt c/o increased right sided incisional pain with discord on height of abdomen super to incision. Exam c/w hematoma not amenable to pressure. Pt was taken back to OR 12 hrs later and had wound hematoma evacuation and re approximation of incision,. Postoperative course was then unremarkable  Consults: None  Significant Diagnostic Studies: labs:  CBC    Component Value Date/Time   WBC 13.3* 12/04/2013 0515   RBC 3.70* 12/04/2013 0515   HGB 10.3* 12/04/2013 0515   HCT 30.2* 12/04/2013 0515   PLT 243 12/04/2013 0515   MCV 81.6 12/04/2013 0515   MCH 27.8 12/04/2013 0515   MCHC 34.1 12/04/2013 0515   RDW 13.1 12/04/2013 0515      Treatments: surgery: exploratory laparotomy, myomectomy. Evacuation of wound hematoma  Discharge Exam: Blood pressure 121/57, pulse 81, temperature 97.7 F (36.5 C), temperature source Oral, resp. rate 18, height 5\' 11"  (1.803 m), weight 104.781 kg (231 lb), last menstrual period 11/08/2013, SpO2 99.00%. General appearance: alert, cooperative and no distress Resp: clear to auscultation bilaterally Cardio: regular rate and rhythm, S1, S2 normal, no murmur, click, rub or gallop GI: soft, non-tender; bowel sounds normal; no masses,  no organomegaly and incision: intact. dry small blood stain noted Pelvic: deferred Right side of abdomen with ecchymosis from pressure dressing Disposition: Final discharge disposition not  confirmed  Discharge Instructions   Diet general    Complete by:  As directed      Discharge patient    Complete by:  As directed      Discharge wound care:    Complete by:  As directed   Staple removal Friday am     Driving Restrictions    Complete by:  As directed   No driving self two weeks     Lifting restrictions    Complete by:  As directed   Cant lift anything greater than 25lbs     May walk up steps    Complete by:  As directed      Sexual Activity Restrictions    Complete by:  As directed   Nothing for vagina for 4- 6 weeks            Medication List         EXCEDRIN PO  Take 1 tablet by mouth daily as needed (headache).     ibuprofen 800 MG tablet  Commonly known as:  ADVIL,MOTRIN  Take 1 tablet (800 mg total) by mouth every 8 (eight) hours as needed (mild pain).     multivitamin with minerals tablet  Take 1 tablet by mouth daily.     oxyCODONE-acetaminophen 5-325 MG per tablet  Commonly known as:  PERCOCET/ROXICET  Take 1-2 tablets by mouth every 4 (four) hours as needed for severe pain (moderate to severe pain (when tolerating fluids)).      script sent for zofran 8 mg po bid     Follow-up Information   Follow up with COUSINS,SHERONETTE A, MD On 12/10/2013. (staple removal, For wound re-check)    Specialty:  Obstetrics and Gynecology  Contact information:   [address] [place] [address] [phone]       Follow up with COUSINS,SHERONETTE A, MD In 4 weeks.   Specialty:  Obstetrics and Gynecology   Contact information:   [address] [place] [address] [phone]       Signed: Servando Salina A 12/05/2013, 11:04 AM

## 2013-12-06 ENCOUNTER — Encounter (HOSPITAL_COMMUNITY): Payer: Self-pay | Admitting: Obstetrics and Gynecology

## 2015-02-14 ENCOUNTER — Other Ambulatory Visit (HOSPITAL_COMMUNITY): Payer: Self-pay | Admitting: Obstetrics and Gynecology

## 2015-02-14 DIAGNOSIS — Z09 Encounter for follow-up examination after completed treatment for conditions other than malignant neoplasm: Secondary | ICD-10-CM

## 2015-02-16 ENCOUNTER — Ambulatory Visit (HOSPITAL_COMMUNITY)
Admission: RE | Admit: 2015-02-16 | Discharge: 2015-02-16 | Disposition: A | Discharge status: Home / Self Care | Payer: Federal, State, Local not specified - PPO | Source: Ambulatory Visit | Attending: Obstetrics and Gynecology | Admitting: Obstetrics and Gynecology

## 2015-02-16 DIAGNOSIS — N979 Female infertility, unspecified: Secondary | ICD-10-CM | POA: Diagnosis not present

## 2015-02-16 DIAGNOSIS — Z09 Encounter for follow-up examination after completed treatment for conditions other than malignant neoplasm: Secondary | ICD-10-CM

## 2015-02-16 MED ORDER — IOHEXOL 300 MG/ML  SOLN
30.0000 mL | Freq: Once | INTRAMUSCULAR | Status: DC | PRN
  Administered 2015-02-16: 30 mL
  Filled 2015-02-16: qty 30

## 2015-05-09 LAB — OB RESULTS CONSOLE ANTIBODY SCREEN: Antibody Screen: NEGATIVE

## 2015-05-09 LAB — OB RESULTS CONSOLE RPR: RPR: NONREACTIVE

## 2015-05-09 LAB — OB RESULTS CONSOLE GC/CHLAMYDIA
Chlamydia: NEGATIVE
GC PROBE AMP, GENITAL: NEGATIVE

## 2015-05-09 LAB — OB RESULTS CONSOLE ABO/RH: RH Type: POSITIVE

## 2015-05-09 LAB — OB RESULTS CONSOLE RUBELLA ANTIBODY, IGM: RUBELLA: IMMUNE

## 2015-05-09 LAB — OB RESULTS CONSOLE HIV ANTIBODY (ROUTINE TESTING): HIV: NONREACTIVE

## 2015-05-09 LAB — OB RESULTS CONSOLE HEPATITIS B SURFACE ANTIGEN: Hepatitis B Surface Ag: NEGATIVE

## 2015-09-18 DIAGNOSIS — Z3A27 27 weeks gestation of pregnancy: Secondary | ICD-10-CM | POA: Diagnosis not present

## 2015-09-18 DIAGNOSIS — O3663X0 Maternal care for excessive fetal growth, third trimester, not applicable or unspecified: Secondary | ICD-10-CM | POA: Diagnosis not present

## 2015-09-18 DIAGNOSIS — Z36 Encounter for antenatal screening of mother: Secondary | ICD-10-CM | POA: Diagnosis not present

## 2015-09-26 DIAGNOSIS — K080 Exfoliation of teeth due to systemic causes: Secondary | ICD-10-CM | POA: Diagnosis not present

## 2015-10-06 DIAGNOSIS — Z3A30 30 weeks gestation of pregnancy: Secondary | ICD-10-CM | POA: Diagnosis not present

## 2015-10-06 DIAGNOSIS — Z23 Encounter for immunization: Secondary | ICD-10-CM | POA: Diagnosis not present

## 2015-10-06 DIAGNOSIS — O3663X0 Maternal care for excessive fetal growth, third trimester, not applicable or unspecified: Secondary | ICD-10-CM | POA: Diagnosis not present

## 2015-10-06 DIAGNOSIS — Z36 Encounter for antenatal screening of mother: Secondary | ICD-10-CM | POA: Diagnosis not present

## 2015-10-16 ENCOUNTER — Other Ambulatory Visit: Payer: Self-pay | Admitting: Obstetrics and Gynecology

## 2015-11-08 DIAGNOSIS — Z36 Encounter for antenatal screening of mother: Secondary | ICD-10-CM | POA: Diagnosis not present

## 2015-11-08 DIAGNOSIS — O3663X0 Maternal care for excessive fetal growth, third trimester, not applicable or unspecified: Secondary | ICD-10-CM | POA: Diagnosis not present

## 2015-11-08 DIAGNOSIS — Z3A35 35 weeks gestation of pregnancy: Secondary | ICD-10-CM | POA: Diagnosis not present

## 2015-11-09 ENCOUNTER — Encounter (HOSPITAL_COMMUNITY): Payer: Self-pay

## 2015-11-09 ENCOUNTER — Other Ambulatory Visit (HOSPITAL_COMMUNITY): Payer: Self-pay | Admitting: Obstetrics and Gynecology

## 2015-11-09 ENCOUNTER — Ambulatory Visit (HOSPITAL_COMMUNITY)
Admission: RE | Admit: 2015-11-09 | Discharge: 2015-11-09 | Disposition: A | Discharge status: Home / Self Care | Payer: Federal, State, Local not specified - PPO | Source: Ambulatory Visit | Attending: Obstetrics and Gynecology | Admitting: Obstetrics and Gynecology

## 2015-11-09 DIAGNOSIS — O09513 Supervision of elderly primigravida, third trimester: Secondary | ICD-10-CM | POA: Diagnosis not present

## 2015-11-09 DIAGNOSIS — Z3A35 35 weeks gestation of pregnancy: Secondary | ICD-10-CM

## 2015-11-09 DIAGNOSIS — IMO0002 Reserved for concepts with insufficient information to code with codable children: Secondary | ICD-10-CM

## 2015-11-09 DIAGNOSIS — O99213 Obesity complicating pregnancy, third trimester: Secondary | ICD-10-CM

## 2015-11-09 DIAGNOSIS — O34593 Maternal care for other abnormalities of gravid uterus, third trimester: Secondary | ICD-10-CM

## 2015-11-09 DIAGNOSIS — Z36 Encounter for antenatal screening of mother: Secondary | ICD-10-CM | POA: Diagnosis not present

## 2015-11-09 DIAGNOSIS — Z3689 Encounter for other specified antenatal screening: Secondary | ICD-10-CM

## 2015-11-09 DIAGNOSIS — O283 Abnormal ultrasonic finding on antenatal screening of mother: Secondary | ICD-10-CM

## 2015-11-09 DIAGNOSIS — O289 Unspecified abnormal findings on antenatal screening of mother: Secondary | ICD-10-CM | POA: Diagnosis not present

## 2015-11-09 HISTORY — DX: Benign neoplasm of connective and other soft tissue, unspecified: D21.9

## 2015-11-10 ENCOUNTER — Encounter (HOSPITAL_COMMUNITY): Payer: Self-pay

## 2015-11-14 DIAGNOSIS — Z3A35 35 weeks gestation of pregnancy: Secondary | ICD-10-CM | POA: Diagnosis not present

## 2015-11-14 DIAGNOSIS — O3429 Maternal care due to uterine scar from other previous surgery: Secondary | ICD-10-CM | POA: Diagnosis not present

## 2015-11-15 DIAGNOSIS — Z3A35 35 weeks gestation of pregnancy: Secondary | ICD-10-CM | POA: Diagnosis not present

## 2015-11-21 ENCOUNTER — Encounter (HOSPITAL_COMMUNITY)
Admission: RE | Admit: 2015-11-21 | Discharge: 2015-11-21 | Disposition: A | Discharge status: Home / Self Care | Payer: Federal, State, Local not specified - PPO | Source: Ambulatory Visit | Attending: Obstetrics and Gynecology | Admitting: Obstetrics and Gynecology

## 2015-11-21 DIAGNOSIS — J029 Acute pharyngitis, unspecified: Secondary | ICD-10-CM | POA: Diagnosis not present

## 2015-11-21 DIAGNOSIS — R0981 Nasal congestion: Secondary | ICD-10-CM | POA: Diagnosis not present

## 2015-11-21 DIAGNOSIS — D252 Subserosal leiomyoma of uterus: Secondary | ICD-10-CM | POA: Diagnosis not present

## 2015-11-21 DIAGNOSIS — Z833 Family history of diabetes mellitus: Secondary | ICD-10-CM | POA: Diagnosis not present

## 2015-11-21 DIAGNOSIS — Z3A37 37 weeks gestation of pregnancy: Secondary | ICD-10-CM | POA: Diagnosis not present

## 2015-11-21 DIAGNOSIS — O3429 Maternal care due to uterine scar from other previous surgery: Secondary | ICD-10-CM | POA: Diagnosis not present

## 2015-11-21 DIAGNOSIS — O3413 Maternal care for benign tumor of corpus uteri, third trimester: Secondary | ICD-10-CM | POA: Diagnosis not present

## 2015-11-21 DIAGNOSIS — O3493 Maternal care for abnormality of pelvic organ, unspecified, third trimester: Secondary | ICD-10-CM | POA: Diagnosis not present

## 2015-11-21 DIAGNOSIS — Z8249 Family history of ischemic heart disease and other diseases of the circulatory system: Secondary | ICD-10-CM | POA: Diagnosis not present

## 2015-11-21 LAB — CBC
HEMATOCRIT: 38.3 % (ref 36.0–46.0)
HEMOGLOBIN: 12.6 g/dL (ref 12.0–15.0)
MCH: 26.6 pg (ref 26.0–34.0)
MCHC: 32.9 g/dL (ref 30.0–36.0)
MCV: 80.8 fL (ref 78.0–100.0)
Platelets: 238 10*3/uL (ref 150–400)
RBC: 4.74 MIL/uL (ref 3.87–5.11)
RDW: 13.9 % (ref 11.5–15.5)
WBC: 13.8 10*3/uL — AB (ref 4.0–10.5)

## 2015-11-21 NOTE — Patient Instructions (Signed)
20 Melanie [name]  11/21/2015   Your procedure is scheduled on:  11/23/2015  Enter through the Main Entrance of [facility] up the phone at the desk and dial [phone].   Call this number if you have problems the morning of surgery: [phone]   Remember:   Do not eat food:After Midnight.  Do not drink clear liquids: After Midnight.  Take these medicines the morning of surgery with A SIP OF WATER: none   Do not wear jewelry, make-up or nail polish.  Do not wear lotions, powders, or perfumes. You may wear deodorant.  Do not shave 48 hours prior to surgery.  Do not bring valuables to the hospital.  [facility] is not   responsible for any belongings or valuables brought to the hospital.  Contacts, dentures or bridgework may not be worn into surgery.  Leave suitcase in the car. After surgery it may be brought to your room.  For patients admitted to the hospital, checkout time is 11:00 AM the day of              discharge.   Patients discharged the day of surgery will not be allowed to drive             home.  Name and phone number of your driver: na  Special Instructions:   Shower using CHG 2 nights before surgery and the night before surgery.  If you shower the day of surgery use CHG.  Use special wash - you have one bottle of CHG for all showers.  You should use approximately 1/3 of the bottle for each shower.   Please read over the following fact sheets that you were given:   Surgical Site Infection Prevention

## 2015-11-22 ENCOUNTER — Encounter (HOSPITAL_COMMUNITY)
Admission: RE | Admit: 2015-11-22 | Discharge: 2015-11-22 | Disposition: A | Discharge status: Home / Self Care | Payer: Federal, State, Local not specified - PPO | Source: Ambulatory Visit

## 2015-11-22 HISTORY — DX: Personal history of other infectious and parasitic diseases: Z86.19

## 2015-11-22 HISTORY — DX: Unspecified abnormal cytological findings in specimens from vagina: R87.629

## 2015-11-22 LAB — RPR: RPR Ser Ql: NONREACTIVE

## 2015-11-22 MED ORDER — CEFAZOLIN SODIUM-DEXTROSE 2-4 GM/100ML-% IV SOLN
2.0000 g | INTRAVENOUS | Status: AC
  Administered 2015-11-23: 2 g via INTRAVENOUS

## 2015-11-22 MED ORDER — TRIAMCINOLONE ACETONIDE 40 MG/ML IJ SUSP
40.0000 mg | Freq: Once | INTRAMUSCULAR | Status: DC
  Filled 2015-11-22: qty 1

## 2015-11-23 ENCOUNTER — Inpatient Hospital Stay (HOSPITAL_COMMUNITY): Payer: Federal, State, Local not specified - PPO | Admitting: Anesthesiology

## 2015-11-23 ENCOUNTER — Inpatient Hospital Stay (HOSPITAL_COMMUNITY)
Admission: RE | Admit: 2015-11-23 | Discharge: 2015-11-25 | DRG: 766 | Disposition: A | Discharge status: Home / Self Care | Payer: Federal, State, Local not specified - PPO | Source: Ambulatory Visit | Attending: Obstetrics and Gynecology | Admitting: Obstetrics and Gynecology

## 2015-11-23 ENCOUNTER — Encounter (HOSPITAL_COMMUNITY)
Admission: RE | Disposition: A | Discharge status: Home / Self Care | Payer: Self-pay | Source: Ambulatory Visit | Attending: Obstetrics and Gynecology

## 2015-11-23 ENCOUNTER — Encounter (HOSPITAL_COMMUNITY): Payer: Self-pay | Admitting: *Deleted

## 2015-11-23 ENCOUNTER — Other Ambulatory Visit (HOSPITAL_COMMUNITY): Payer: Self-pay

## 2015-11-23 DIAGNOSIS — D252 Subserosal leiomyoma of uterus: Secondary | ICD-10-CM | POA: Diagnosis present

## 2015-11-23 DIAGNOSIS — Z8249 Family history of ischemic heart disease and other diseases of the circulatory system: Secondary | ICD-10-CM

## 2015-11-23 DIAGNOSIS — Z3A37 37 weeks gestation of pregnancy: Secondary | ICD-10-CM

## 2015-11-23 DIAGNOSIS — O3493 Maternal care for abnormality of pelvic organ, unspecified, third trimester: Secondary | ICD-10-CM | POA: Diagnosis present

## 2015-11-23 DIAGNOSIS — O3429 Maternal care due to uterine scar from other previous surgery: Secondary | ICD-10-CM | POA: Diagnosis not present

## 2015-11-23 DIAGNOSIS — R0981 Nasal congestion: Secondary | ICD-10-CM | POA: Diagnosis present

## 2015-11-23 DIAGNOSIS — O3413 Maternal care for benign tumor of corpus uteri, third trimester: Secondary | ICD-10-CM | POA: Diagnosis present

## 2015-11-23 DIAGNOSIS — Z833 Family history of diabetes mellitus: Secondary | ICD-10-CM | POA: Diagnosis not present

## 2015-11-23 LAB — PREPARE RBC (CROSSMATCH)

## 2015-11-23 SURGERY — Surgical Case
Anesthesia: Spinal | Site: Abdomen

## 2015-11-23 MED ORDER — KETOROLAC TROMETHAMINE 30 MG/ML IJ SOLN
INTRAMUSCULAR | Status: AC
  Filled 2015-11-23: qty 1

## 2015-11-23 MED ORDER — ZOLPIDEM TARTRATE 5 MG PO TABS
5.0000 mg | ORAL_TABLET | Freq: Every evening | ORAL | Status: DC | PRN
Start: 2015-11-23 — End: 2015-11-25

## 2015-11-23 MED ORDER — SCOPOLAMINE 1 MG/3DAYS TD PT72: 1.0000 | MEDICATED_PATCH | Freq: Once | TRANSDERMAL | Status: DC

## 2015-11-23 MED ORDER — FLEET ENEMA 7-19 GM/118ML RE ENEM: 1.0000 | ENEMA | Freq: Every day | RECTAL | Status: DC | PRN

## 2015-11-23 MED ORDER — MENTHOL 3 MG MT LOZG: 1.0000 | LOZENGE | OROMUCOSAL | Status: DC | PRN

## 2015-11-23 MED ORDER — SIMETHICONE 80 MG PO CHEW
80.0000 mg | CHEWABLE_TABLET | ORAL | Status: DC
  Administered 2015-11-23 – 2015-11-25 (×2): 80 mg via ORAL
  Filled 2015-11-23 (×2): qty 1

## 2015-11-23 MED ORDER — DIBUCAINE 1 % RE OINT: 1.0000 "application " | TOPICAL_OINTMENT | RECTAL | Status: DC | PRN

## 2015-11-23 MED ORDER — ONDANSETRON HCL 4 MG/2ML IJ SOLN: 4.0000 mg | Freq: Three times a day (TID) | INTRAMUSCULAR | Status: DC | PRN

## 2015-11-23 MED ORDER — KETOROLAC TROMETHAMINE 30 MG/ML IJ SOLN: 30.0000 mg | Freq: Four times a day (QID) | INTRAMUSCULAR | Status: DC | PRN

## 2015-11-23 MED ORDER — SIMETHICONE 80 MG PO CHEW
80.0000 mg | CHEWABLE_TABLET | Freq: Three times a day (TID) | ORAL | Status: DC
  Administered 2015-11-23 – 2015-11-25 (×6): 80 mg via ORAL
  Filled 2015-11-23 (×6): qty 1

## 2015-11-23 MED ORDER — ONDANSETRON HCL 4 MG/2ML IJ SOLN
INTRAMUSCULAR | Status: DC | PRN
  Administered 2015-11-23: 4 mg via INTRAVENOUS

## 2015-11-23 MED ORDER — DIPHENHYDRAMINE HCL 25 MG PO CAPS: 25.0000 mg | ORAL_CAPSULE | Freq: Four times a day (QID) | ORAL | Status: DC | PRN

## 2015-11-23 MED ORDER — DIPHENHYDRAMINE HCL 25 MG PO CAPS
25.0000 mg | ORAL_CAPSULE | ORAL | Status: DC | PRN
  Filled 2015-11-23: qty 1

## 2015-11-23 MED ORDER — MEPERIDINE HCL 25 MG/ML IJ SOLN: 6.2500 mg | INTRAMUSCULAR | Status: DC | PRN

## 2015-11-23 MED ORDER — MORPHINE SULFATE (PF) 0.5 MG/ML IJ SOLN
INTRAMUSCULAR | Status: DC | PRN
  Administered 2015-11-23: .2 mg via INTRATHECAL

## 2015-11-23 MED ORDER — LACTATED RINGERS IV SOLN
INTRAVENOUS | Status: DC
  Administered 2015-11-23: 12:00:00 via INTRAVENOUS

## 2015-11-23 MED ORDER — BISACODYL 10 MG RE SUPP: 10.0000 mg | Freq: Every day | RECTAL | Status: DC | PRN

## 2015-11-23 MED ORDER — OXYCODONE HCL 5 MG PO TABS: 10.0000 mg | ORAL_TABLET | ORAL | Status: DC | PRN

## 2015-11-23 MED ORDER — SODIUM CHLORIDE 0.9% FLUSH: 3.0000 mL | INTRAVENOUS | Status: DC | PRN

## 2015-11-23 MED ORDER — PROMETHAZINE HCL 25 MG/ML IJ SOLN: 6.2500 mg | INTRAMUSCULAR | Status: DC | PRN

## 2015-11-23 MED ORDER — SCOPOLAMINE 1 MG/3DAYS TD PT72
1.0000 | MEDICATED_PATCH | Freq: Once | TRANSDERMAL | Status: DC
  Administered 2015-11-23: 1.5 mg via TRANSDERMAL

## 2015-11-23 MED ORDER — PRENATAL MULTIVITAMIN CH
1.0000 | ORAL_TABLET | Freq: Every day | ORAL | Status: DC
  Administered 2015-11-24 – 2015-11-25 (×2): 1 via ORAL
  Filled 2015-11-23 (×2): qty 1

## 2015-11-23 MED ORDER — PHENYLEPHRINE 8 MG IN D5W 100 ML (0.08MG/ML) PREMIX OPTIME
INJECTION | INTRAVENOUS | Status: DC | PRN
  Administered 2015-11-23: 60 ug/min via INTRAVENOUS

## 2015-11-23 MED ORDER — DIPHENHYDRAMINE HCL 50 MG/ML IJ SOLN: 12.5000 mg | INTRAMUSCULAR | Status: DC | PRN

## 2015-11-23 MED ORDER — LACTATED RINGERS IV SOLN
40.0000 [IU] | INTRAVENOUS | Status: DC | PRN
  Administered 2015-11-23: 40 [IU] via INTRAVENOUS

## 2015-11-23 MED ORDER — TRIAMCINOLONE ACETONIDE 40 MG/ML IJ SUSP
INTRAMUSCULAR | Status: DC | PRN
  Administered 2015-11-23: 40 mg via INTRAMUSCULAR

## 2015-11-23 MED ORDER — METHYLERGONOVINE MALEATE 0.2 MG PO TABS: 0.2000 mg | ORAL_TABLET | ORAL | Status: DC | PRN

## 2015-11-23 MED ORDER — KETOROLAC TROMETHAMINE 30 MG/ML IJ SOLN
30.0000 mg | Freq: Four times a day (QID) | INTRAMUSCULAR | Status: DC | PRN
  Administered 2015-11-23: 30 mg via INTRAMUSCULAR

## 2015-11-23 MED ORDER — OXYTOCIN 40 UNITS IN LACTATED RINGERS INFUSION - SIMPLE MED: 2.5000 [IU]/h | INTRAVENOUS | Status: AC

## 2015-11-23 MED ORDER — LACTATED RINGERS IV SOLN
INTRAVENOUS | Status: DC | PRN
  Administered 2015-11-23: 13:00:00 via INTRAVENOUS

## 2015-11-23 MED ORDER — NALBUPHINE HCL 10 MG/ML IJ SOLN: 5.0000 mg | Freq: Once | INTRAMUSCULAR | Status: DC | PRN

## 2015-11-23 MED ORDER — NALOXONE HCL 0.4 MG/ML IJ SOLN: 0.4000 mg | INTRAMUSCULAR | Status: DC | PRN

## 2015-11-23 MED ORDER — NALBUPHINE HCL 10 MG/ML IJ SOLN: 5.0000 mg | INTRAMUSCULAR | Status: DC | PRN

## 2015-11-23 MED ORDER — WITCH HAZEL-GLYCERIN EX PADS: 1.0000 "application " | MEDICATED_PAD | CUTANEOUS | Status: DC | PRN

## 2015-11-23 MED ORDER — SODIUM CHLORIDE 0.9 % IV SOLN: 250.0000 mL | INTRAVENOUS | Status: DC

## 2015-11-23 MED ORDER — FENTANYL CITRATE (PF) 100 MCG/2ML IJ SOLN
INTRAMUSCULAR | Status: DC | PRN
  Administered 2015-11-23: 10 ug via INTRATHECAL

## 2015-11-23 MED ORDER — OXYCODONE HCL 5 MG PO TABS: 5.0000 mg | ORAL_TABLET | ORAL | Status: DC | PRN

## 2015-11-23 MED ORDER — IBUPROFEN 600 MG PO TABS
600.0000 mg | ORAL_TABLET | Freq: Four times a day (QID) | ORAL | Status: DC
  Administered 2015-11-23 – 2015-11-25 (×7): 600 mg via ORAL
  Filled 2015-11-23 (×7): qty 1

## 2015-11-23 MED ORDER — OXYTOCIN 10 UNIT/ML IJ SOLN
INTRAMUSCULAR | Status: AC
  Filled 2015-11-23: qty 4

## 2015-11-23 MED ORDER — FENTANYL CITRATE (PF) 100 MCG/2ML IJ SOLN: 25.0000 ug | INTRAMUSCULAR | Status: DC | PRN

## 2015-11-23 MED ORDER — FERROUS SULFATE 325 (65 FE) MG PO TABS
325.0000 mg | ORAL_TABLET | Freq: Two times a day (BID) | ORAL | Status: DC
  Administered 2015-11-24 – 2015-11-25 (×3): 325 mg via ORAL
  Filled 2015-11-23 (×4): qty 1

## 2015-11-23 MED ORDER — COCONUT OIL OIL: 1.0000 "application " | TOPICAL_OIL | Status: DC | PRN

## 2015-11-23 MED ORDER — BUPIVACAINE IN DEXTROSE 0.75-8.25 % IT SOLN
INTRATHECAL | Status: DC | PRN
  Administered 2015-11-23: 1.6 mg via INTRATHECAL

## 2015-11-23 MED ORDER — SODIUM CHLORIDE 0.9% FLUSH: 3.0000 mL | Freq: Two times a day (BID) | INTRAVENOUS | Status: DC

## 2015-11-23 MED ORDER — ONDANSETRON HCL 4 MG/2ML IJ SOLN
INTRAMUSCULAR | Status: AC
Start: 2015-11-23 — End: 2015-11-23
  Filled 2015-11-23: qty 2

## 2015-11-23 MED ORDER — LACTATED RINGERS IV SOLN
INTRAVENOUS | Status: DC
  Administered 2015-11-23 (×2): via INTRAVENOUS

## 2015-11-23 MED ORDER — SIMETHICONE 80 MG PO CHEW: 80.0000 mg | CHEWABLE_TABLET | ORAL | Status: DC | PRN

## 2015-11-23 MED ORDER — MORPHINE SULFATE (PF) 0.5 MG/ML IJ SOLN
INTRAMUSCULAR | Status: AC
  Filled 2015-11-23: qty 10

## 2015-11-23 MED ORDER — PHENYLEPHRINE 8 MG IN D5W 100 ML (0.08MG/ML) PREMIX OPTIME
INJECTION | INTRAVENOUS | Status: AC
  Filled 2015-11-23: qty 100

## 2015-11-23 MED ORDER — FENTANYL CITRATE (PF) 100 MCG/2ML IJ SOLN
INTRAMUSCULAR | Status: AC
  Filled 2015-11-23: qty 2

## 2015-11-23 MED ORDER — METHYLERGONOVINE MALEATE 0.2 MG/ML IJ SOLN: 0.2000 mg | INTRAMUSCULAR | Status: DC | PRN

## 2015-11-23 MED ORDER — BUPIVACAINE HCL (PF) 0.25 % IJ SOLN
INTRAMUSCULAR | Status: AC
  Filled 2015-11-23: qty 30

## 2015-11-23 MED ORDER — SCOPOLAMINE 1 MG/3DAYS TD PT72
MEDICATED_PATCH | TRANSDERMAL | Status: AC
  Administered 2015-11-23: 1.5 mg via TRANSDERMAL
  Filled 2015-11-23: qty 1

## 2015-11-23 MED ORDER — NALOXONE HCL 2 MG/2ML IJ SOSY
1.0000 ug/kg/h | PREFILLED_SYRINGE | INTRAVENOUS | Status: DC | PRN
  Filled 2015-11-23: qty 2

## 2015-11-23 MED ORDER — SENNOSIDES-DOCUSATE SODIUM 8.6-50 MG PO TABS
2.0000 | ORAL_TABLET | ORAL | Status: DC
  Administered 2015-11-23 – 2015-11-25 (×2): 2 via ORAL
  Filled 2015-11-23 (×2): qty 2

## 2015-11-23 MED ORDER — BUPIVACAINE HCL (PF) 0.25 % IJ SOLN
INTRAMUSCULAR | Status: DC | PRN
  Administered 2015-11-23: 10 mL
  Administered 2015-11-23: 20 mL

## 2015-11-23 MED ORDER — TRIAMCINOLONE ACETONIDE 40 MG/ML IJ SUSP
40.0000 mg | Freq: Once | INTRAMUSCULAR | Status: DC
  Filled 2015-11-23: qty 1

## 2015-11-23 MED ORDER — LACTATED RINGERS IV SOLN
INTRAVENOUS | Status: DC
  Administered 2015-11-23: 23:00:00 via INTRAVENOUS

## 2015-11-23 MED ORDER — LACTATED RINGERS IV SOLN: INTRAVENOUS | Status: DC

## 2015-11-23 SURGICAL SUPPLY — 44 items
APL SKNCLS STERI-STRIP NONHPOA (GAUZE/BANDAGES/DRESSINGS) ×1
BARRIER ADHS 3X4 INTERCEED (GAUZE/BANDAGES/DRESSINGS) ×2 IMPLANT
BENZOIN TINCTURE PRP APPL 2/3 (GAUZE/BANDAGES/DRESSINGS) ×1 IMPLANT
BRR ADH 4X3 ABS CNTRL BYND (GAUZE/BANDAGES/DRESSINGS) ×1
CLAMP CORD UMBIL (MISCELLANEOUS) IMPLANT
CLOTH BEACON ORANGE TIMEOUT ST (SAFETY) ×2 IMPLANT
CONTAINER PREFILL 10% NBF 15ML (MISCELLANEOUS) IMPLANT
DRAPE C SECTION CLR SCREEN (DRAPES) ×2 IMPLANT
DRSG OPSITE POSTOP 4X10 (GAUZE/BANDAGES/DRESSINGS) ×2 IMPLANT
DURAPREP 26ML APPLICATOR (WOUND CARE) ×2 IMPLANT
ELECT REM PT RETURN 9FT ADLT (ELECTROSURGICAL) ×2
ELECTRODE REM PT RTRN 9FT ADLT (ELECTROSURGICAL) ×1 IMPLANT
EXTRACTOR VACUUM M CUP 4 TUBE (SUCTIONS) IMPLANT
GLOVE BIOGEL PI IND STRL 7.0 (GLOVE) ×2 IMPLANT
GLOVE BIOGEL PI INDICATOR 7.0 (GLOVE) ×2
GLOVE ECLIPSE 6.5 STRL STRAW (GLOVE) ×2 IMPLANT
GOWN STRL REUS W/TWL LRG LVL3 (GOWN DISPOSABLE) ×4 IMPLANT
KIT ABG SYR 3ML LUER SLIP (SYRINGE) IMPLANT
NDL HYPO 25X5/8 SAFETYGLIDE (NEEDLE) IMPLANT
NEEDLE HYPO 22GX1.5 SAFETY (NEEDLE) ×2 IMPLANT
NEEDLE HYPO 25X5/8 SAFETYGLIDE (NEEDLE) IMPLANT
NS IRRIG 1000ML POUR BTL (IV SOLUTION) ×2 IMPLANT
PACK C SECTION WH (CUSTOM PROCEDURE TRAY) ×2 IMPLANT
PAD OB MATERNITY 4.3X12.25 (PERSONAL CARE ITEMS) ×2 IMPLANT
RTRCTR C-SECT PINK 25CM LRG (MISCELLANEOUS) IMPLANT
STRIP CLOSURE SKIN 1/2X4 (GAUZE/BANDAGES/DRESSINGS) ×1 IMPLANT
SUT CHROMIC GUT AB #0 18 (SUTURE) IMPLANT
SUT MNCRL 0 VIOLET CTX 36 (SUTURE) ×3 IMPLANT
SUT MON AB 2-0 SH 27 (SUTURE)
SUT MON AB 2-0 SH27 (SUTURE) IMPLANT
SUT MON AB 3-0 SH 27 (SUTURE)
SUT MON AB 3-0 SH27 (SUTURE) IMPLANT
SUT MON AB 4-0 PS1 27 (SUTURE) IMPLANT
SUT MONOCRYL 0 CTX 36 (SUTURE) ×3
SUT PLAIN 2 0 (SUTURE)
SUT PLAIN 2 0 XLH (SUTURE) IMPLANT
SUT PLAIN ABS 2-0 CT1 27XMFL (SUTURE) IMPLANT
SUT VIC AB 0 CT1 36 (SUTURE) ×4 IMPLANT
SUT VIC AB 2-0 CT1 27 (SUTURE) ×2
SUT VIC AB 2-0 CT1 TAPERPNT 27 (SUTURE) ×1 IMPLANT
SUT VIC AB 4-0 PS2 27 (SUTURE) IMPLANT
SYR CONTROL 10ML LL (SYRINGE) ×2 IMPLANT
TOWEL OR 17X24 6PK STRL BLUE (TOWEL DISPOSABLE) ×2 IMPLANT
TRAY FOLEY CATH SILVER 14FR (SET/KITS/TRAYS/PACK) IMPLANT

## 2015-11-23 NOTE — Lactation Note (Addendum)
This note was copied from a baby's chart. Lactation Consultation Note  Patient Name: [name] [id number] Date: 11/23/2015 Reason for consult: Initial assessment Baby at 4 hr of life. Mom denies breast or nipple pain. Parents had questions about voids and latching. Discussed LPT baby behavior, feeding frequency, baby belly size, voids, wt loss, breast changes, and nipple care. Mom stated she can manually express and has a spoon in room. Given lactation and LPT infant handouts. Aware of OP services and support group. Mom was sleepy and visitors were present.     Maternal Data Has patient been taught Hand Expression?: Yes Does the patient have breastfeeding experience prior to this delivery?: No  Feeding Feeding Type: Breast Fed Length of feed: 10 min  LATCH Score/Interventions Latch: Grasps breast easily, tongue down, lips flanged, rhythmical sucking.  Audible Swallowing: A few with stimulation Intervention(s): Skin to skin;Hand expression  Type of Nipple: Everted at rest and after stimulation  Comfort (Breast/Nipple): Soft / non-tender     Hold (Positioning): Full assist, staff holds infant at breast  LATCH Score: 7  Lactation Tools Discussed/Used     Consult Status Consult Status: Follow-up Date: 11/24/15 Follow-up type: In-patient    Denzil Hughes 11/23/2015, 5:56 PM

## 2015-11-23 NOTE — Anesthesia Postprocedure Evaluation (Signed)
Anesthesia Post Note  Patient: Melanie [name]  Procedure(s) Performed: Procedure(s) (LRB): Primary CESAREAN SECTION (N/A)  Patient location during evaluation: Mother Baby Anesthesia Type: Spinal Level of consciousness: awake and alert and oriented Pain management: satisfactory to patient Vital Signs Assessment: post-procedure vital signs reviewed and stable Respiratory status: respiratory function stable and spontaneous breathing Cardiovascular status: blood pressure returned to baseline Postop Assessment: no headache, no backache, spinal receding, patient able to bend at knees and adequate PO intake Anesthetic complications: no     Last Vitals:  Filed Vitals:   11/23/15 1745 11/23/15 1845  BP: 129/67 110/51  Pulse: 69 68  Temp:    Resp: 20 18    Last Pain:  Filed Vitals:   11/23/15 1850  PainSc: 0-No pain   Pain Goal: Patients Stated Pain Goal: 0 (11/23/15 1545)               Dilon Lank

## 2015-11-23 NOTE — Op Note (Signed)
Patient with low transverse skin incision and classical uterine incision.  Baby presented vertex with compound right hand presentation. Per Dr Garwin Brothers, Jackelyn Knife, RN 11/23/2015 1:27 PM

## 2015-11-23 NOTE — Anesthesia Procedure Notes (Signed)
Spinal Patient location during procedure: OR Start time: 11/23/2015 12:53 PM End time: 11/23/2015 12:54 PM Staffing Anesthesiologist: Suella Broad D Performed by: anesthesiologist  Preanesthetic Checklist Completed: patient identified, site marked, surgical consent, pre-op evaluation, timeout performed, IV checked, risks and benefits discussed and monitors and equipment checked Spinal Block Patient position: sitting Prep: Betadine Patient monitoring: heart rate, continuous pulse ox, blood pressure and cardiac monitor Approach: midline Location: L4-5 Injection technique: single-shot Needle Needle type: Whitacre and Introducer  Needle gauge: 24 G Needle length: 9 cm Additional Notes Negative paresthesia. Negative blood return. Positive free-flowing CSF. Expiration date of kit checked and confirmed. Patient tolerated procedure well, without complications.

## 2015-11-23 NOTE — Addendum Note (Signed)
Addendum  created 11/23/15 1944 by Flossie Dibble, CRNA   Modules edited: Notes Section   Notes Section:  File: [id number]

## 2015-11-23 NOTE — Anesthesia Postprocedure Evaluation (Signed)
Anesthesia Post Note  Patient: Kathi Simpers  Procedure(s) Performed: Procedure(s) (LRB): Primary CESAREAN SECTION (N/A)  Patient location during evaluation: PACU Anesthesia Type: Spinal Level of consciousness: oriented and awake and alert Pain management: pain level controlled Vital Signs Assessment: post-procedure vital signs reviewed and stable Respiratory status: spontaneous breathing, respiratory function stable and patient connected to nasal cannula oxygen Cardiovascular status: blood pressure returned to baseline and stable Postop Assessment: no headache, no backache and spinal receding Anesthetic complications: no     Last Vitals:  Filed Vitals:   11/23/15 1515 11/23/15 1516  BP:    Pulse: 58 62  Temp:    Resp: 13     Last Pain: There were no vitals filed for this visit. Pain Goal: Patients Stated Pain Goal: 0 (11/23/15 1419)               Effie Berkshire

## 2015-11-23 NOTE — Brief Op Note (Signed)
11/23/2015  2:19 PM  PATIENT:  Melanie [name]  35 y.o. female  PRE-OPERATIVE DIAGNOSIS:  Previous Myomectomy, IUP @ 37 wk  POST-OPERATIVE DIAGNOSIS:  Previous Myomectomy, IUP @ 37 weeks  PROCEDURE:  Primary Classical /low vertical cesarean section  SURGEON:  Surgeon(s) and Role:    * Servando Salina, MD - Primary  PHYSICIAN ASSISTANT:   ASSISTANTS: tanya bailey, cnm   ANESTHESIA:   spinal Findings: live female, nl tubes and ovaries, left ant SS 2.5 cm, narrow LUS EBL:  Total I/O In: 2500 [I.V.:2500] Out: 750 [Urine:50; Blood:700]  BLOOD ADMINISTERED:none  DRAINS: none   LOCAL MEDICATIONS USED:  MARCAINE     SPECIMEN:  No Specimen  DISPOSITION OF SPECIMEN:  N/A  COUNTS:  YES  TOURNIQUET:  * No tourniquets in log *  DICTATION: .Other Dictation: Dictation Number [id number]  PLAN OF CARE: Admit to inpatient   PATIENT DISPOSITION:  PACU - hemodynamically stable.   Delay start of Pharmacological VTE agent (>24hrs) due to surgical blood loss or risk of bleeding: no

## 2015-11-23 NOTE — Transfer of Care (Signed)
Immediate Anesthesia Transfer of Care Note  Patient: Melanie [name]  Procedure(s) Performed: Procedure(s) with comments: Primary CESAREAN SECTION (N/A) - EDD: 12/13/15  Patient Location: PACU  Anesthesia Type:Spinal  Level of Consciousness: awake, alert  and oriented  Airway & Oxygen Therapy: Patient Spontanous Breathing  Post-op Assessment: Report given to RN and Post -op Vital signs reviewed and stable  Post vital signs: Reviewed and stable  Last Vitals:  Filed Vitals:   11/23/15 1155  BP: 154/94  Temp: 36.9 C  Resp: 18    Last Pain: There were no vitals filed for this visit.    Patients Stated Pain Goal: 3 ([id number])  Complications: No apparent anesthesia complications

## 2015-11-23 NOTE — Anesthesia Preprocedure Evaluation (Addendum)
Anesthesia Evaluation  Patient identified by MRN, date of birth, ID band Patient awake    Reviewed: Allergy & Precautions, NPO status , Patient's Chart, lab work & pertinent test results  Airway Mallampati: I  TM Distance: >3 FB Neck ROM: Full    Dental  (+) Teeth Intact, Dental Advisory Given   Pulmonary neg pulmonary ROS,    breath sounds clear to auscultation       Cardiovascular negative cardio ROS   Rhythm:Regular Rate:Normal     Neuro/Psych negative neurological ROS  negative psych ROS   GI/Hepatic negative GI ROS, Neg liver ROS,   Endo/Other  negative endocrine ROS  Renal/GU negative Renal ROS  negative genitourinary   Musculoskeletal negative musculoskeletal ROS (+)   Abdominal   Peds negative pediatric ROS (+)  Hematology negative hematology ROS (+)   Anesthesia Other Findings   Reproductive/Obstetrics (+) Pregnancy                            Lab Results  Component Value Date   WBC 13.8* 11/21/2015   HGB 12.6 11/21/2015   HCT 38.3 11/21/2015   MCV 80.8 11/21/2015   PLT 238 11/21/2015   No results found for: INR, PROTIME   Anesthesia Physical Anesthesia Plan  ASA: III  Anesthesia Plan: Spinal   Post-op Pain Management:    Induction:   Airway Management Planned: Natural Airway  Additional Equipment:   Intra-op Plan:   Post-operative Plan:   Informed Consent: I have reviewed the patients History and Physical, chart, labs and discussed the procedure including the risks, benefits and alternatives for the proposed anesthesia with the patient or authorized representative who has indicated his/her understanding and acceptance.     Plan Discussed with:   Anesthesia Plan Comments:         Anesthesia Quick Evaluation

## 2015-11-23 NOTE — Op Note (Signed)
NAMEGILLIE, MCCONE            ACCOUNT NO.:  [id number]  MEDICAL RECORD NO.:  [id number]  LOCATION:  [id number]                          FACILITY:  [facility]  PHYSICIAN:  Servando Salina, M.D.DATE OF BIRTH:  Feb 06, 1981  DATE OF PROCEDURE:  11/23/2015 DATE OF DISCHARGE:                              OPERATIVE REPORT   PREOPERATIVE DIAGNOSIS:  Previous myomectomy, intrauterine gestation at 37 weeks.  PROCEDURE:  Primary classical cesarean section.  POSTOPERATIVE DIAGNOSIS:  Previous myomectomy, intrauterine gestation at 37 weeks.  ANESTHESIA:  Spinal.  SURGEON:  Servando Salina, MD.  ASSISTANT:  Curtis Sites, CNM.  DESCRIPTION OF PROCEDURE:  Under adequate spinal anesthesia, the patient was placed in a supine position with a left lateral tilt.  She was sterilely prepped and draped in usual fashion.  An indwelling Foley catheter was sterilely placed.  The patient had a keloid incision and requested removal of that at the time of her surgery.  Kenalog with Marcaine was injected along the previous keloid Pfannenstiel skin incision site.  An incision was made below the keloid and carried down to the rectus fascia.  Rectus fascia was opened transversely.  The rectus fascia was then bluntly and sharply dissected off the rectus muscle in superior and inferior fashion.  The rectus muscle was carefully split in the midline.  The parietal peritoneum was opened bluntly and extended.  A very narrow lower uterine segment was noted along with large uterine vessels laterally were noted.  A self-retaining Alexis retractor was then placed.  The vesicouterine peritoneum was opened transversely.  The bladder was displaced inferiorly.  Initially, A low vertical incision was made in the lower uterine segment; but as it became thickened, it was extended superiorly.  Artificial rupture of membranes occurred.  Clear amniotic fluid noted.  Subsequent delivery of a live female who was bulb suctioned from the  abdomen.  Cord was clamped after delay for one  minute.  Apgars of 7 and 8 at 1 and 5 minutes were assigned.  The placenta which was anterior was manually removed.  Uterine cavity was cleaned of debris.  Uterine incision did not have an extension.  The incision was closed in two layers, the first layer with 0 Monocryl running lock stitch.  Second layer was closed with imbricating 0 Monocryl suture.  There was a little over 2 cm left anterior subserosal fibroid.  No adhesions were noted otherwise, and both tubes and ovaries were noted to be normal.  The abdomen was irrigated and suctioned of debris.  The Alexis retractor was removed. Interceed was placed in the lower uterine segment.  The parietal peritoneum was closed with 2-0 Vicryl.  The rectus fascia was closed with 0 Vicryl x2.  The subcutaneous area was irrigated.  Small bleeders cauterized.  A proximal concave incision was made over the remaining keloid scar, and the keloid scar was excised.  Once this was done, the subcutaneous area was irrigated, and small bleeders were cauterized again.  Interrupted 2-0 plain sutures were placed in the subcutaneous area.  Additional Marcaine with Kenalog was injected along both superior and inferior aspects of the incision. The incision was then closed with 4-0 Vicryl subcuticular closure. Benzoin and steri strip placed.  SPECIMENS:  Placenta not sent to Pathology.  ESTIMATED BLOOD LOSS:  700 mL.  INTRAOPERATIVE FLUIDS:  1100 mL.  URINE OUTPUT:  50 mL clear yellow urine.  COUNTS:  Sponge and instrument counts x2 was correct.  COMPLICATIONS:  None.  DISPOSITION:  The patient tolerated the procedure well, was transferred to Recovery in stable condition.     Servando Salina, M.D.     [place]/MEDQ  D:  11/23/2015  T:  11/23/2015  Job:  [id number]

## 2015-11-24 LAB — CBC
HEMATOCRIT: 31.5 % — AB (ref 36.0–46.0)
HEMOGLOBIN: 10.4 g/dL — AB (ref 12.0–15.0)
MCH: 26.6 pg (ref 26.0–34.0)
MCHC: 33 g/dL (ref 30.0–36.0)
MCV: 80.6 fL (ref 78.0–100.0)
Platelets: 201 10*3/uL (ref 150–400)
RBC: 3.91 MIL/uL (ref 3.87–5.11)
RDW: 14 % (ref 11.5–15.5)
WBC: 13.7 10*3/uL — AB (ref 4.0–10.5)

## 2015-11-24 LAB — BIRTH TISSUE RECOVERY COLLECTION (PLACENTA DONATION)

## 2015-11-24 MED ORDER — LORATADINE 10 MG PO TABS
10.0000 mg | ORAL_TABLET | Freq: Every day | ORAL | Status: DC
  Administered 2015-11-24 – 2015-11-25 (×2): 10 mg via ORAL
  Filled 2015-11-24 (×2): qty 1

## 2015-11-24 NOTE — Lactation Note (Signed)
This note was copied from a baby's chart. Lactation Consultation Note Follow up visit at 29 hours of age.  Baby has had 5 feedings in past 24 hours with 3 after his circ this am.  Mom is concerned about compression with latch on left nipples.  LC advised mom to call for assist with latch if she is having pain or needs assist.  Discussed basics with pillow support and STS.  MOm reports hand expression with colostrum visible. FOB is at bedside and supportive.  Discussed cluster feeding at length and answered all questions.    Patient Name: Melanie [name] [id number] Date: 11/24/2015 Reason for consult: Follow-up assessment   Maternal Data    Feeding Feeding Type: Breast Fed Length of feed: 17 min  LATCH Score/Interventions                Intervention(s): Breastfeeding basics reviewed;Support Pillows;Skin to skin;Position options     Lactation Tools Discussed/Used     Consult Status Consult Status: Follow-up Date: 11/25/15 Follow-up type: In-patient    Justice Britain 11/24/2015, 6:58 PM

## 2015-11-24 NOTE — Progress Notes (Signed)
Patient ID: Melanie [name], female   DOB: Mar 05, 1981, 35 y.o.   MRN: [id number] POD # 1  Subjective: Pt reports feeling well / Pain controlled with ibuprofen  C/O nasal congestion and symptoms of "cold" prior to admission.  No cough, but congestion persists Tolerating po/ Foley d/c'ed and voiding without problems/ No n/v/Flatus pos Activity: out of bed and ambulate Bleeding is light Newborn info:  Information for the patient's newborn:  Melanie [name] [id number]  female  / circ performed this am per Dr Garwin Brothers / Feeding: breast   Objective: VS: Blood pressure 119/57, pulse 57, temperature 97.8 F (36.6 C), temperature source Oral, resp. rate 18, last menstrual period 03/08/2015, SpO2 97 %, unknown if currently breastfeeding.    Intake/Output Summary (Last 24 hours) at 11/24/15 1101 Last data filed at 11/24/15 1039  Gross per 24 hour  Intake   2500 ml  Output   4050 ml  Net  -1550 ml      Recent Labs  11/21/15 1200 11/24/15 0557  WBC 13.8* 13.7*  HGB 12.6 10.4*  HCT 38.3 31.5*  PLT 238 201    Blood type: --/--/O POS (06/13 1200) Rubella: Immune (11/29 0000)    Physical Exam:  General: alert, cooperative and no distress CV: Regular rate and rhythm Resp: clear Abdomen: soft, nontender, normal bowel sounds Incision: Covered with Tegaderm and honeycomb dressing; well approximated. Uterine Fundus: firm, below umbilicus, nontender Lochia: minimal Ext: Homans sign is negative, no sign of DVT and no edema, redness or tenderness in the calves or thighs    A/P: POD # 1 G1P1001 S/P Primary C/Section @ 37 wks d/t hx myomectomy  Nasal congestion.  Will add claritin Doing well and considering early d/c home tomorrow Continue routine post op orders   Signed: Darleen Crocker, MSN, [facility] 11/24/2015, 11:01 AM

## 2015-11-25 LAB — TYPE AND SCREEN
ABO/RH(D): O POS
ANTIBODY SCREEN: NEGATIVE
UNIT DIVISION: 0
Unit division: 0

## 2015-11-25 MED ORDER — IMIQUIMOD 5 % EX CREA: TOPICAL_CREAM | Freq: Every day | CUTANEOUS | Status: DC

## 2015-11-25 MED ORDER — OXYCODONE HCL 5 MG PO TABS: 5.0000 mg | ORAL_TABLET | ORAL | Status: DC | PRN

## 2015-11-25 MED ORDER — IBUPROFEN 600 MG PO TABS: 600.0000 mg | ORAL_TABLET | Freq: Four times a day (QID) | ORAL | Status: DC

## 2015-11-25 NOTE — Discharge Summary (Signed)
  POSTOPERATIVE DISCHARGE SUMMARY:  Patient ID: Melanie [name] MRN: [id number] DOB/AGE: 09-29-1980 35 y.o.  Admit date: 11/23/2015 Admission Diagnoses: 37.1 weeks / previous myomectomy  Discharge date:  11/25/2015 Discharge Diagnoses: POD s/p primary cesarean section for hx myomectomy  Prenatal history: G1P1001   EDC : 12/13/2015, by Last Menstrual Period  Prenatal care at [facility]  Primary provider : [facility] Prenatal course complicated by [facility] / previous myomectomy  Prenatal Labs: ABO, Rh: --/--/O POS (06/13 1200)  Antibody: NEG (06/13 1200) Rubella: Immune (11/29 0000)   RPR: Non Reactive (06/13 1200)  HBsAg: Negative (11/29 0000)  HIV: Non-reactive (11/29 0000)    Medical / Surgical History :  Past medical history:  Past Medical History  Diagnosis Date  . Vaginal Pap smear, abnormal   . Hx of varicella   . Fibroid     Past surgical history:  Past Surgical History  Procedure Laterality Date  . Laparotomy N/A 12/04/2013    Procedure: Exploratory Laparotomy with Evacuation Hematoma ;  Surgeon: Marvene Staff, MD;  Location: [facility];  Service: Gynecology;  Laterality: N/A;  . Myomectomy N/A 12/03/2013    Procedure: Exploratory Laparotomy MYOMECTOMY;  Surgeon: Marvene Staff, MD;  Location: [facility];  Service: Gynecology;  Laterality: N/A;  . Cesarean section N/A 11/23/2015    Procedure: Primary CESAREAN SECTION;  Surgeon: Servando Salina, MD;  Location: [facility];  Service: Obstetrics;  Laterality: N/A;  EDD: 12/13/15    Family History:  Family History  Problem Relation Age of Onset  . Cancer Father     prostate  . Diabetes Father   . Heart disease Father   . Cancer Paternal Grandmother     colon  . Diabetes Paternal Grandmother     Social History:  reports that she has never smoked. She has never used smokeless tobacco. She reports that she drinks alcohol. She reports that she does not use illicit drugs.  Allergies: Latex    Current Medications at time of admission:  Prior to Admission medications    Procedures: Cesarean section delivery on 11/23/2015 with delivery of viable newborn by Dr Garwin Brothers   See operative report for further details APGAR (1 MIN): 7   APGAR (5 MINS): 8    Postoperative / postpartum course:  Uncomplicated with discharge on POD 2  Discharge Instructions:  Discharged Condition: stable  Activity: pelvic rest and postoperative restrictions x 2   Diet: routine  Medications:    Medication List    TAKE these medications        ibuprofen 600 MG tablet  Commonly known as:  ADVIL,MOTRIN  Take 1 tablet (600 mg total) by mouth every 6 (six) hours.     imiquimod 5 % cream  Commonly known as:  ALDARA  Apply topically at bedtime. Small amount to incision x 4 weeks     oxyCODONE 5 MG immediate release tablet  Commonly known as:  Oxy IR/ROXICODONE  Take 1 tablet (5 mg total) by mouth every 4 (four) hours as needed (pain scale 4-7).        Wound Care: keep clean and dry / remove honeycomb POD 5 Postpartum Instructions: [facility] discharge booklet - instructions reviewed  Discharge to: Home  Follow up :   [facility] in 6 weeks for routine postpartum visit with DR Garwin Brothers                Signed: Artelia Laroche CNM, MSN, [facility] 11/25/2015, 8:55 AM

## 2015-11-25 NOTE — Lactation Note (Signed)
This note was copied from a baby's chart. Lactation Consultation Note  Taking photographs.  LC will follow up later.  Patient Name: Melanie [name] [id number] Date: 11/25/2015     Maternal Data    Feeding    LATCH Score/Interventions Latch: Grasps breast easily, tongue down, lips flanged, rhythmical sucking.  Audible Swallowing: A few with stimulation  Type of Nipple: Everted at rest and after stimulation  Comfort (Breast/Nipple): Soft / non-tender     Hold (Positioning): Assistance needed to correctly position infant at breast and maintain latch.  LATCH Score: 8  Lactation Tools Discussed/Used     Consult Status      Vivianne Master [facility] 11/25/2015, 11:08 AM

## 2015-11-25 NOTE — Progress Notes (Signed)
POSTOPERATIVE DAY # 2 S/P CS   S:         Reports feeling well & ready to go home             Tolerating po intake / no nausea / no vomiting / + flatus / no BM             Bleeding is light             Pain controlled with motrin and oxycodone             Up ad lib / ambulatory/ voiding QS  Newborn breast feeding     O:  VS: BP 126/68 mmHg  Pulse 67  Temp(Src) 98 F (36.7 C) (Oral)  Resp 18  SpO2 99%  LMP 03/08/2015  Breastfeeding? Unknown   LABS:               Recent Labs  11/24/15 0557  WBC 13.7*  HGB 10.4*  PLT 201               Bloodtype: --/--/O POS (06/13 1200)  Rubella: Immune (11/29 0000)                                             I&O: Intake/Output      06/16 0701 - 06/17 0700 06/17 0701 - 06/18 0700   I.V.     Total Intake       Urine 2000    Blood     Total Output 2000     Net -2000                       Physical Exam:             Alert and Oriented X3  Lungs: Clear and unlabored  Heart: regular rate and rhythm / no mumurs  Abdomen: soft, non-tender, non-distended actiev BS             Fundus: firm, non-tender, Ueven             Dressing intact honeycomb              Incision:  approximated with suture / no erythema / no ecchymosis / no drainage  Perineum: intact  Lochia: light  Extremities: trace edema, no calf pain or tenderness, negative Homans  A:        POD # 2 S/P CS            Hx keloid - incision revision - postop care  P:        Routine postoperative care              DC home             Start Aldara at 2 weeks with removal steri-strips   Artelia Laroche CNM, MSN, [facility] 11/25/2015, 8:31 AM

## 2016-01-08 DIAGNOSIS — Z130 Encounter for screening for diseases of the blood and blood-forming organs and certain disorders involving the immune mechanism: Secondary | ICD-10-CM | POA: Diagnosis not present

## 2016-04-09 DIAGNOSIS — K080 Exfoliation of teeth due to systemic causes: Secondary | ICD-10-CM | POA: Diagnosis not present

## 2016-10-08 DIAGNOSIS — N390 Urinary tract infection, site not specified: Secondary | ICD-10-CM | POA: Diagnosis not present

## 2016-10-17 DIAGNOSIS — K080 Exfoliation of teeth due to systemic causes: Secondary | ICD-10-CM | POA: Diagnosis not present

## 2017-01-28 DIAGNOSIS — Z01419 Encounter for gynecological examination (general) (routine) without abnormal findings: Secondary | ICD-10-CM | POA: Diagnosis not present

## 2017-01-28 DIAGNOSIS — Z1151 Encounter for screening for human papillomavirus (HPV): Secondary | ICD-10-CM | POA: Diagnosis not present

## 2017-01-28 DIAGNOSIS — Z6831 Body mass index (BMI) 31.0-31.9, adult: Secondary | ICD-10-CM | POA: Diagnosis not present

## 2017-08-18 DIAGNOSIS — J069 Acute upper respiratory infection, unspecified: Secondary | ICD-10-CM | POA: Diagnosis not present

## 2018-12-31 DIAGNOSIS — R5383 Other fatigue: Secondary | ICD-10-CM | POA: Diagnosis not present

## 2018-12-31 DIAGNOSIS — R5381 Other malaise: Secondary | ICD-10-CM | POA: Diagnosis not present

## 2019-01-01 DIAGNOSIS — R5383 Other fatigue: Secondary | ICD-10-CM | POA: Diagnosis not present

## 2019-01-01 DIAGNOSIS — R5381 Other malaise: Secondary | ICD-10-CM | POA: Diagnosis not present

## 2019-01-15 ENCOUNTER — Encounter: Payer: Self-pay | Admitting: Family Medicine

## 2019-01-15 ENCOUNTER — Other Ambulatory Visit: Payer: Self-pay

## 2019-01-15 ENCOUNTER — Ambulatory Visit (INDEPENDENT_AMBULATORY_CARE_PROVIDER_SITE_OTHER): Payer: Federal, State, Local not specified - PPO | Admitting: Family Medicine

## 2019-01-15 DIAGNOSIS — Z713 Dietary counseling and surveillance: Secondary | ICD-10-CM | POA: Diagnosis not present

## 2019-01-15 DIAGNOSIS — Z7689 Persons encountering health services in other specified circumstances: Secondary | ICD-10-CM | POA: Diagnosis not present

## 2019-01-15 DIAGNOSIS — R293 Abnormal posture: Secondary | ICD-10-CM

## 2019-01-15 NOTE — Progress Notes (Signed)
Virtual Visit via Video Note  I connected with Melanie [name] on 01/15/19 at  3:30 PM EDT by a video enabled telemedicine application 2/2 [id number] pandemic and verified that I am speaking with the correct person using two identifiers.  Location patient: home Location provider:work or home office Persons participating in the virtual visit: patient, provider  I discussed the limitations of evaluation and management by telemedicine and the availability of in person appointments. The patient expressed understanding and agreed to proceed.   HPI: Pt is a 38 yo female with no sig pmh, does note skin sensitivities.  Pt followed by OB/Gyn, Dr. Garwin Brothers.  States has not really had a pcp.    Nutrition concern: -Pt inquires if she should take a MVI and vit D -states was on PNV for yrs -eats mostly chicken, fish, fruits, and vegetables -was doing zoomba and walking prior to covid pandemic.  Posture concern: -pt notes slightly noticeable curvature of neck, questions if it is genetic? -denies pain or other symptoms -inquires if anything can be done to correct the hunch.  Alleries: NKDA Nickel -skin irritation Latex-skin irritation  Past Surgical Hx: Myomectomy 2015 c-section 2017  ROS: See pertinent positives and negatives per HPI.  Social hx:   Pt is married.  Pt has a 26 yo.  Pt's dad is a Lexicographer who retired recently.  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  Pt endorses social EtOH use.  Pt denies tobacco and drug use.    Family Medical Hx: Mom- lung cancer Dad- prostate cancer, preDM, heart issues. PGM-colon cancer MGF- pancreativ  Past Medical History:  Diagnosis Date  . Fibroid   . Hx of varicella   . Vaginal Pap smear, abnormal     Past Surgical History:  Procedure Laterality Date  . CESAREAN SECTION N/A 11/23/2015   Procedure: Primary CESAREAN SECTION;  Surgeon: Servando Salina, MD;  Location: [place];  Service: Obstetrics;  Laterality: N/A;  EDD: 12/13/15  . LAPAROTOMY N/A 12/04/2013   Procedure: Exploratory Laparotomy with Evacuation Hematoma ;  Surgeon: Marvene Staff, MD;  Location: [facility];  Service: Gynecology;  Laterality: N/A;  . MYOMECTOMY N/A 12/03/2013   Procedure: Exploratory Laparotomy MYOMECTOMY;  Surgeon: Marvene Staff, MD;  Location: [facility];  Service: Gynecology;  Laterality: N/A;    Family History  Problem Relation Age of Onset  . Cancer Father        prostate  . Diabetes Father   . Heart disease Father   . Cancer Paternal Grandmother        colon  . Diabetes Paternal Grandmother      Current Outpatient Medications:  .  ibuprofen (ADVIL,MOTRIN) 600 MG tablet, Take 1 tablet (600 mg total) by mouth every 6 (six) hours., Disp: 30 tablet, Rfl: 0 .  imiquimod (ALDARA) 5 % cream, Apply topically at bedtime. Small amount to incision x 4 weeks, Disp: 12 each, Rfl: 0 .  oxyCODONE (OXY IR/ROXICODONE) 5 MG immediate release tablet, Take 1 tablet (5 mg total) by mouth every 4 (four) hours as needed (pain scale 4-7)., Disp: 30 tablet, Rfl: 0  EXAM:  VITALS per patient if applicable:  GENERAL: alert, oriented, appears well and in no acute distress  HEENT: atraumatic, conjunctiva clear, no obvious abnormalities on inspection of external nose and ears  NECK: normal movements of the head and neck  LUNGS: on inspection no signs of respiratory distress,  breathing rate appears normal, no obvious gross SOB, gasping or wheezing  CV: no obvious cyanosis  MS: moves all visible extremities without noticeable abnormality.  Mild forward contour of lower cervical/upper thoracic spine.  PSYCH/NEURO: pleasant and cooperative, no obvious depression or anxiety, speech and thought processing grossly intact  ASSESSMENT AND PLAN:  Discussed the following assessment and plan:  Poor posture  -Plan: Ambulatory referral to Physical Therapy  Nutrition counseling -ok to  take a MVI -discussed eating a variety of fruits and vegetables, drinking plenty of water and exercising.  Encounter to establish care  -We reviewed the PMH, PSH, FH, SH, Meds and Allergies. -We provided refills for any medications we will prescribe as needed. -We addressed current concerns per orders and patient instructions. -We have asked for records for pertinent exams, studies, vaccines and notes from previous providers. -We have advised patient to follow up per instructions below.  F/u prn   I discussed the assessment and treatment plan with the patient. The patient was provided an opportunity to ask questions and all were answered. The patient agreed with the plan and demonstrated an understanding of the instructions.   The patient was advised to call back or seek an in-person evaluation if the symptoms worsen or if the condition fails to improve as anticipated.  Billie Ruddy, MD

## 2019-01-29 ENCOUNTER — Other Ambulatory Visit: Payer: Self-pay

## 2019-01-29 ENCOUNTER — Ambulatory Visit: Payer: Federal, State, Local not specified - PPO | Attending: Family Medicine | Admitting: Physical Therapy

## 2019-01-29 ENCOUNTER — Encounter: Payer: Self-pay | Admitting: Physical Therapy

## 2019-01-29 DIAGNOSIS — R293 Abnormal posture: Secondary | ICD-10-CM | POA: Insufficient documentation

## 2019-01-29 DIAGNOSIS — M6281 Muscle weakness (generalized): Secondary | ICD-10-CM

## 2019-01-29 NOTE — Patient Instructions (Signed)
Access Code: [email or web address]  URL: [email or web address]  Date: 01/29/2019  Prepared by: Earlie Counts   Exercises Seated Correct Posture - 1x daily - 7x weekly Neck Retraction - 5 reps - 1 sets - 1x daily - 7x weekly Doorway Pec Stretch at 90 Degrees Abduction - 1 reps - 1 sets - 30 sec hold - 2x daily - 7x weekly Doorway Pec Stretch at 120 Degrees Abduction - 1 reps - 1 sets - 30 sec hold - 2x daily - 7x weekly Seated Thoracic Lumbar Extension with Pectoralis Stretch - 2 reps - 1 sets - 5 sec hold - 2x daily - 7x weekly Standing Thoracic Spine Stretch - 2 reps - 1 sets - 15 sec hold - 2x daily - 7x weekly Patient Education Office Posture Forward Head Posture Trigger Point Dry Needling [facility] [address], [place], [place] [address] Phone # [phone] Fax [phone]

## 2019-01-29 NOTE — Therapy (Signed)
[facility] [address] [place], [place], [address] Phone: [phone]   Fax:  [phone]  Physical Therapy Evaluation  Patient Details  Name: Melanie [name] MRN: [id number] Date of Birth: Aug 12, 1980 Referring Provider (PT): Dr. Grier Mitts   Encounter Date: 01/29/2019  PT End of Session - 01/29/19 0835    Visit Number  1    Date for PT Re-Evaluation  03/12/19    Authorization Type  BCBS    PT Start Time  0800    PT Stop Time  0832    PT Time Calculation (min)  32 min    Activity Tolerance  Patient tolerated treatment well    Behavior During Therapy  [facility] for tasks assessed/performed       Past Medical History:  Diagnosis Date  . Fibroid   . Hx of varicella   . Vaginal Pap smear, abnormal     Past Surgical History:  Procedure Laterality Date  . CESAREAN SECTION N/A 11/23/2015   Procedure: Primary CESAREAN SECTION;  Surgeon: Servando Salina, MD;  Location: [place];  Service: Obstetrics;  Laterality: N/A;  EDD: 12/13/15  . LAPAROTOMY N/A 12/04/2013   Procedure: Exploratory Laparotomy with Evacuation Hematoma ;  Surgeon: Marvene Staff, MD;  Location: [facility];  Service: Gynecology;  Laterality: N/A;  . MYOMECTOMY N/A 12/03/2013   Procedure: Exploratory Laparotomy MYOMECTOMY;  Surgeon: Marvene Staff, MD;  Location: [facility];  Service: Gynecology;  Laterality: N/A;    There were no vitals filed for this visit.   Subjective Assessment - 01/29/19 0805    Subjective  Since I am tall I have tried to make me smaller. I am having a curvature at the neck and back. Patient is concerned with her posture.    Patient Stated Goals  poor posture    Currently in Pain?  No/denies    Multiple Pain Sites  No         [facility] PT Assessment - 01/29/19 0001      Assessment   Medical Diagnosis  R29.3 Poor Posture    Referring Provider (PT)  Dr. Grier Mitts    Onset Date/Surgical Date  --    chronic   Prior Therapy  none      Precautions   Precautions  None      Restrictions   Weight Bearing Restrictions  No      Balance Screen   Has the patient fallen in the past 6 months  No    Has the patient had a decrease in activity level because of a fear of falling?   No    Is the patient reluctant to leave their home because of a fear of falling?   No      Home Film/video editor residence      Prior Function   Level of Independence  Independent    Vocation  Full time employment    Advertising account planner- sit and stand    Leisure  walk, Zumba      Cognition   Overall Cognitive Status  Within Functional Limits for tasks assessed      Posture/Postural Control   Posture/Postural Control  Postural limitations    Postural Limitations  Increased thoracic kyphosis;Rounded Shoulders;Forward head      ROM / Strength   AROM / PROM / Strength  AROM;PROM;Strength      AROM   Cervical Extension  40  Thoracic - Right Rotation  decreased by 25%      Strength   Right Shoulder Horizontal ABduction  4/5    Left Shoulder Horizontal ABduction  4/5      Palpation   Spinal mobility  decreased mobility of C7-T4    Palpation comment  increased tissue thickness bilateral sides of T1-T4                Objective measurements completed on examination: See above findings.      [facility] Adult PT Treatment/Exercise - 01/29/19 0001      Therapeutic Activites    Therapeutic Activities  Other Therapeutic Activities    Other Therapeutic Activities  instruction on correct posture in standing and at work desk      Neck Exercises: Stretches   Chest Stretch  2 reps;30 seconds   arms at different angles; doorway   Other Neck Stretches  upper thoracic stretch over chair, chin retraction, upper thoracic stretch in standing              PT Education - 01/29/19 0834    Education Details  Access Code: [email or web address]; postural education    Person(s)  Educated  Patient    Methods  Explanation;Demonstration;Handout    Comprehension  Returned demonstration;Verbalized understanding       PT Short Term Goals - 01/29/19 0840      PT SHORT TERM GOAL #1   Title  independent with initial HEP    Time  3    Status  New    Target Date  02/19/19        PT Long Term Goals - 01/29/19 0840      PT LONG TERM GOAL #1   Title  independent with HEP    Time  6    Period  Weeks    Status  New    Target Date  03/12/19      PT LONG TERM GOAL #2   Title  improved cervical and thoracic ROM due to improved tissue mobility so she is able to demonstrate correct posture    Time  6    Period  Weeks    Status  New    Target Date  03/12/19      PT LONG TERM GOAL #3   Title  bilateral shoulder horizontal abduction >/= 5/5 so she is able to sit upright with reduction of cervical thoracic kyphosis    Time  6    Period  Weeks    Status  New    Target Date  03/12/19             Plan - 01/29/19 0835    Clinical Impression Statement  Patient is a 38 year old female with poor posture that is chronic. Patient reports no pain. Patient has increased cervical thoracic kyphosis, forward head and rounded shoulders. Patient has decreased cervical extension and right thoracic rotation. Patient has increased fascial thickness on bilateral sides of T1-T4. Patient has decreased mobility of C7-T4. Patient has decreased bilateral horizontal abduction of shoulders. Patient has tight pectoralis and first rib movement.    Personal Factors and Comorbidities  Fitness;Profession    Stability/Clinical Decision Making  Stable/Uncomplicated    Clinical Decision Making  Low    Rehab Potential  Excellent    PT Frequency  1x / week    PT Duration  6 weeks    PT Treatment/Interventions  Cryotherapy;Electrical Stimulation;Moist Heat;Therapeutic exercise;Therapeutic activities;Neuromuscular re-education;Patient/family education;Dry needling;Passive range of motion;Manual  techniques  PT Next Visit Plan  dry needling on bil. sides of C7-T4; joint mobilization to C7-T4; soft tissue work to the area; postural muscle strengthening; foam roll    PT Home Exercise Plan  Access Code: [email or web address]    Consulted and Agree with Plan of Care  Patient       Patient will benefit from skilled therapeutic intervention in order to improve the following deficits and impairments:  Decreased range of motion, Increased fascial restricitons, Postural dysfunction, Decreased strength, Decreased mobility  Visit Diagnosis: Abnormal posture - Plan: PT plan of care cert/re-cert  Muscle weakness (generalized) - Plan: PT plan of care cert/re-cert     Problem List Patient Active Problem List   Diagnosis Date Noted  . Postpartum care following cesarean delivery (11/23/15) 11/23/2015  . S/P myomectomy 12/03/2013    Earlie Counts, PT 01/29/19 8:46 AM   [facility] [address], [place], [place], [address] Phone: [phone]   Fax:  [phone]  Name: Melanie [name] MRN: [id number] Date of Birth: July 28, 1980

## 2019-02-05 ENCOUNTER — Encounter

## 2019-02-12 ENCOUNTER — Ambulatory Visit: Payer: Federal, State, Local not specified - PPO | Attending: Family Medicine | Admitting: Physical Therapy

## 2019-02-12 ENCOUNTER — Other Ambulatory Visit: Payer: Self-pay

## 2019-02-12 ENCOUNTER — Encounter: Payer: Self-pay | Admitting: Physical Therapy

## 2019-02-12 DIAGNOSIS — M6281 Muscle weakness (generalized): Secondary | ICD-10-CM

## 2019-02-12 DIAGNOSIS — R293 Abnormal posture: Secondary | ICD-10-CM | POA: Insufficient documentation

## 2019-02-12 NOTE — Therapy (Signed)
[facility] [address] [place], [place], [address] Phone: [phone]   Fax:  [phone]  Physical Therapy Treatment  Patient Details  Name: Melanie [name] MRN: [id number] Date of Birth: 1981-05-05 Referring Provider (PT): Dr. Grier Mitts   Encounter Date: 02/12/2019  PT End of Session - 02/12/19 0807    Visit Number  2    Date for PT Re-Evaluation  03/12/19    Authorization Type  BCBS    PT Start Time  0805    PT Stop Time  0843    PT Time Calculation (min)  38 min    Activity Tolerance  Patient tolerated treatment well    Behavior During Therapy  [facility] for tasks assessed/performed       Past Medical History:  Diagnosis Date  . Fibroid   . Hx of varicella   . Vaginal Pap smear, abnormal     Past Surgical History:  Procedure Laterality Date  . CESAREAN SECTION N/A 11/23/2015   Procedure: Primary CESAREAN SECTION;  Surgeon: Servando Salina, MD;  Location: [facility];  Service: Obstetrics;  Laterality: N/A;  EDD: 12/13/15  . LAPAROTOMY N/A 12/04/2013   Procedure: Exploratory Laparotomy with Evacuation Hematoma ;  Surgeon: Marvene Staff, MD;  Location: [facility];  Service: Gynecology;  Laterality: N/A;  . MYOMECTOMY N/A 12/03/2013   Procedure: Exploratory Laparotomy MYOMECTOMY;  Surgeon: Marvene Staff, MD;  Location: [facility];  Service: Gynecology;  Laterality: N/A;    There were no vitals filed for this visit.  Subjective Assessment - 02/12/19 0805    Subjective  Everyday I have remebered my stretches.    Patient Stated Goals  poor posture    Currently in Pain?  No/denies                       [facility] Adult PT Treatment/Exercise - 02/12/19 0001      Therapeutic Activites    Therapeutic Activities  Other Therapeutic Activities    Other Therapeutic Activities  postural education in front of the mirror      Neck Exercises: Supine   Other Supine Exercise  supine with  foam roller across the thoracic spine and extend; lay on foam roll to decompress, then alternate shoulder flexion, then bilateral shoulder flexion,     Other Supine Exercise  horizontal shoulder abduction with red band 10x then green band 10x on foam roll the nsame with diagonals      Manual Therapy   Manual Therapy  Soft tissue mobilization;Joint mobilization    Joint Mobilization  P-A to T1-T4 grade 3     Soft tissue mobilization  cervical and upper thoracic paras       Trigger Point Dry Needling - 02/12/19 0001    Consent Given?  Yes    Education Handout Provided  Yes    Muscles Treated Head and Neck  Cervical multifidi    Other Dry Needling  thoracic multifidi T1-T4    Cervical multifidi Response  Twitch reponse elicited;Palpable increased muscle length           PT Education - 02/12/19 0842    Education Details  Access Code: [email or web address]; postural education in front of mirror    Person(s) Educated  Patient    Methods  Explanation;Demonstration;Handout    Comprehension  Verbalized understanding;Returned demonstration       PT Short Term Goals - 01/29/19 0840      PT SHORT TERM GOAL #  1   Title  independent with initial HEP    Time  3    Status  New    Target Date  02/19/19        PT Long Term Goals - 01/29/19 0840      PT LONG TERM GOAL #1   Title  independent with HEP    Time  6    Period  Weeks    Status  New    Target Date  03/12/19      PT LONG TERM GOAL #2   Title  improved cervical and thoracic ROM due to improved tissue mobility so she is able to demonstrate correct posture    Time  6    Period  Weeks    Status  New    Target Date  03/12/19      PT LONG TERM GOAL #3   Title  bilateral shoulder horizontal abduction >/= 5/5 so she is able to sit upright with reduction of cervical thoracic kyphosis    Time  6    Period  Weeks    Status  New    Target Date  03/12/19            Plan - 02/12/19 [id number]    Clinical Impression Statement  Patient  continues to need verbal cues to correct her posture. Patient was able to stand taller after therapy. Patient is working on postural muscle strength. Patient did well with dry needling. Patient will benefit from skilled therapy to improve postural strength, elongate tight muscles, and improve mobility.    Personal Factors and Comorbidities  Fitness;Profession    Stability/Clinical Decision Making  Stable/Uncomplicated    Rehab Potential  Excellent    PT Frequency  1x / week    PT Duration  6 weeks    PT Treatment/Interventions  Cryotherapy;Electrical Stimulation;Moist Heat;Therapeutic exercise;Therapeutic activities;Neuromuscular re-education;Patient/family education;Dry needling;Passive range of motion;Manual techniques    PT Next Visit Plan  dry needling on bil. sides of C7-T4; joint mobilization to C7-T4; soft tissue work to the area; postural muscle strengthening; foam roll; pectoralis stretch; chin retraction with cervical extension    PT Home Exercise Plan  Access Code: [email or web address]    Recommended Other Services  MD signed initial eval    Consulted and Agree with Plan of Care  Patient       Patient will benefit from skilled therapeutic intervention in order to improve the following deficits and impairments:  Decreased range of motion, Increased fascial restricitons, Postural dysfunction, Decreased strength, Decreased mobility  Visit Diagnosis: Abnormal posture  Muscle weakness (generalized)     Problem List Patient Active Problem List   Diagnosis Date Noted  . Postpartum care following cesarean delivery (11/23/15) 11/23/2015  . S/P myomectomy 12/03/2013    Earlie Counts, PT 02/12/19 8:46 AM   [facility] [address], [place], [place], [address] Phone: [phone]   Fax:  [phone]  Name: Melanie [name] MRN: [id number] Date of Birth: 02/17/1981

## 2019-02-12 NOTE — Patient Instructions (Signed)
Access Code: [email or web address]  URL: [email or web address]  Date: 02/12/2019  Prepared by: Earlie Counts   Exercises Seated Correct Posture - 1x daily - 7x weekly Neck Retraction - 5 reps - 1 sets - 1x daily - 7x weekly Doorway Pec Stretch at 90 Degrees Abduction - 1 reps - 1 sets - 30 sec hold - 2x daily - 7x weekly Doorway Pec Stretch at 120 Degrees Abduction - 1 reps - 1 sets - 30 sec hold - 2x daily - 7x weekly Seated Thoracic Lumbar Extension with Pectoralis Stretch - 2 reps - 1 sets - 5 sec hold - 2x daily - 7x weekly Standing Thoracic Spine Stretch - 2 reps - 1 sets - 15 sec hold - 2x daily - 7x weekly Supine Shoulder Horizontal Abduction with Resistance - 10 reps - 1 sets - 1x daily - 7x weekly Seated Shoulder Diagonal with Resistance - 10 reps - 1 sets - 1x daily - 7x weekly Patient Education Office Posture Forward Head Posture Trigger Point Dry Needling [facility] [address], [place], [place] [address] Phone # [phone] Fax [phone]

## 2019-02-19 ENCOUNTER — Other Ambulatory Visit: Payer: Self-pay

## 2019-02-19 ENCOUNTER — Ambulatory Visit: Payer: Federal, State, Local not specified - PPO | Admitting: Physical Therapy

## 2019-02-19 ENCOUNTER — Encounter: Payer: Self-pay | Admitting: Physical Therapy

## 2019-02-19 DIAGNOSIS — M6281 Muscle weakness (generalized): Secondary | ICD-10-CM | POA: Diagnosis not present

## 2019-02-19 DIAGNOSIS — R293 Abnormal posture: Secondary | ICD-10-CM | POA: Diagnosis not present

## 2019-02-19 NOTE — Patient Instructions (Signed)
Access Code: [email or web address]  URL: [email or web address]  Date: 02/19/2019  Prepared by: Earlie Counts   Exercises Seated Correct Posture - 1x daily - 7x weekly Neck Retraction - 5 reps - 1 sets - 1x daily - 7x weekly Doorway Pec Stretch at 90 Degrees Abduction - 1 reps - 1 sets - 30 sec hold - 2x daily - 7x weekly Doorway Pec Stretch at 120 Degrees Abduction - 1 reps - 1 sets - 30 sec hold - 2x daily - 7x weekly Seated Thoracic Lumbar Extension with Pectoralis Stretch - 2 reps - 1 sets - 5 sec hold - 2x daily - 7x weekly Standing Thoracic Spine Stretch - 2 reps - 1 sets - 15 sec hold - 2x daily - 7x weekly Seated Shoulder Diagonal with Resistance - 10 reps - 1 sets - 1x daily - 7x weekly Seated Shoulder Horizontal Abduction with Resistance - 10 reps - 1 sets - 1x daily - 7x weekly Shoulder W - External Rotation with Resistance - 10 reps - 1 sets - 1x daily - 7x weekly Patient Education Office Posture Forward Head Posture Trigger Point Dry Needling [facility] [address] [place], [place] [address] Phone # [phone] Fax [phone]

## 2019-02-19 NOTE — Therapy (Signed)
[facility] [address], [place], [place], [address] Phone: [phone]   Fax:  [phone]  Physical Therapy Treatment  Patient Details  Name: Melanie [name] MRN: [id number] Date of Birth: 1980-12-10 Referring Provider (PT): Dr. Grier Mitts   Encounter Date: 02/19/2019  PT End of Session - 02/19/19 0844    Visit Number  3    Date for PT Re-Evaluation  03/12/19    Authorization Type  BCBS    PT Start Time  0800    PT Stop Time  0843    PT Time Calculation (min)  43 min    Activity Tolerance  Patient tolerated treatment well    Behavior During Therapy  [facility] for tasks assessed/performed       Past Medical History:  Diagnosis Date  . Fibroid   . Hx of varicella   . Vaginal Pap smear, abnormal     Past Surgical History:  Procedure Laterality Date  . CESAREAN SECTION N/A 11/23/2015   Procedure: Primary CESAREAN SECTION;  Surgeon: Servando Salina, MD;  Location: [place];  Service: Obstetrics;  Laterality: N/A;  EDD: 12/13/15  . LAPAROTOMY N/A 12/04/2013   Procedure: Exploratory Laparotomy with Evacuation Hematoma ;  Surgeon: Marvene Staff, MD;  Location: [facility];  Service: Gynecology;  Laterality: N/A;  . MYOMECTOMY N/A 12/03/2013   Procedure: Exploratory Laparotomy MYOMECTOMY;  Surgeon: Marvene Staff, MD;  Location: [facility];  Service: Gynecology;  Laterality: N/A;    There were no vitals filed for this visit.  Subjective Assessment - 02/19/19 0806    Subjective  No discomfort. I had soreness after dry needling. I have more mindfulness of my posture and I have been checking to see if my ears are over my shoulders.    Patient Stated Goals  poor posture    Currently in Pain?  No/denies                       [facility] Adult PT Treatment/Exercise - 02/19/19 0001      Self-Care   Self-Care  Other Self-Care Comments    Other Self-Care Comments   education on posture and the  spine to know where we are elongating and reducing      Neck Exercises: Machines for Strengthening   UBE (Upper Arm Bike)  level 4 1 min every direction for 6 minutes   while assessing patient     Shoulder Exercises: Standing   Horizontal ABduction  Strengthening;Both;15 reps;Theraband    Theraband Level (Shoulder Horizontal ABduction)  Level 3 (Green)    External Rotation  Strengthening;Both;15 reps;Theraband    Theraband Level (Shoulder External Rotation)  Level 3 (Green)    Extension  Strengthening;Both;15 reps;Theraband    Theraband Level (Shoulder Extension)  Level 2 (Red)      Manual Therapy   Manual Therapy  Soft tissue mobilization;Joint mobilization    Joint Mobilization  P-A to T1-T4 grade 3     Soft tissue mobilization  cervical and upper thoracic paras       Trigger Point Dry Needling - 02/19/19 0001    Consent Given?  Yes    Education Handout Provided  --   gave to patient previously   Muscles Treated Head and Neck  Cervical multifidi    Other Dry Needling  thoracic multifidi T1-T4    Cervical multifidi Response  Twitch reponse elicited;Palpable increased muscle length  PT Education - 02/19/19 0824    Education Details  Access Code: [email or web address]    Person(s) Educated  Patient    Methods  Explanation;Demonstration;Handout    Comprehension  Verbalized understanding;Returned demonstration       PT Short Term Goals - 02/19/19 0806      PT SHORT TERM GOAL #1   Title  independent with initial HEP    Time  3    Status  Achieved        PT Long Term Goals - 01/29/19 0840      PT LONG TERM GOAL #1   Title  independent with HEP    Time  6    Period  Weeks    Status  New    Target Date  03/12/19      PT LONG TERM GOAL #2   Title  improved cervical and thoracic ROM due to improved tissue mobility so she is able to demonstrate correct posture    Time  6    Period  Weeks    Status  New    Target Date  03/12/19      PT LONG TERM GOAL #3   Title   bilateral shoulder horizontal abduction >/= 5/5 so she is able to sit upright with reduction of cervical thoracic kyphosis    Time  6    Period  Weeks    Status  New    Target Date  03/12/19            Plan - 02/19/19 0817    Clinical Impression Statement  Patient is more aware of her posture. Patient has less tissue restriction in the upper thoracic area. Patient has tightness in the cervical paraspinals. Patient is able to elongate her neck with greater ease. Patient needs some tactile cues to retract the scapula. Patient will benefit from skilled therapy to improve postural strength, elongate tight muscles, and improve mobility.    Personal Factors and Comorbidities  Fitness;Profession    Stability/Clinical Decision Making  Stable/Uncomplicated    Rehab Potential  Excellent    PT Frequency  1x / week    PT Duration  6 weeks    PT Treatment/Interventions  Cryotherapy;Electrical Stimulation;Moist Heat;Therapeutic exercise;Therapeutic activities;Neuromuscular re-education;Patient/family education;Dry needling;Passive range of motion;Manual techniques    PT Next Visit Plan  dry needling on bil. sides of C7-T4; joint mobilization to C7-T4; soft tissue work to the area; postural muscle strengthening; foam roll; pectoralis stretch; chin retraction with cervical extension    PT Home Exercise Plan  Access Code: [email or web address]    Consulted and Agree with Plan of Care  Patient       Patient will benefit from skilled therapeutic intervention in order to improve the following deficits and impairments:  Decreased range of motion, Increased fascial restricitons, Postural dysfunction, Decreased strength, Decreased mobility  Visit Diagnosis: Abnormal posture  Muscle weakness (generalized)     Problem List Patient Active Problem List   Diagnosis Date Noted  . Postpartum care following cesarean delivery (11/23/15) 11/23/2015  . S/P myomectomy 12/03/2013    Earlie Counts, PT 02/19/19 8:47  AM   [facility] [address], [place], [place], [address] Phone: [phone]   Fax:  [phone]  Name: Melanie [name] MRN: [id number] Date of Birth: 10-10-1980

## 2019-02-24 ENCOUNTER — Encounter: Payer: Self-pay | Admitting: Physical Therapy

## 2019-02-24 ENCOUNTER — Other Ambulatory Visit: Payer: Self-pay

## 2019-02-24 ENCOUNTER — Ambulatory Visit: Payer: Federal, State, Local not specified - PPO | Admitting: Physical Therapy

## 2019-02-24 DIAGNOSIS — M6281 Muscle weakness (generalized): Secondary | ICD-10-CM | POA: Diagnosis not present

## 2019-02-24 DIAGNOSIS — R293 Abnormal posture: Secondary | ICD-10-CM

## 2019-02-24 NOTE — Therapy (Signed)
[facility] [address] [place], [place], [address] Phone: [phone]   Fax:  [phone]  Physical Therapy Treatment  Patient Details  Name: Melanie [name] MRN: [id number] Date of Birth: 1981-03-30 Referring Provider (PT): Dr. Grier Mitts   Encounter Date: 02/24/2019  PT End of Session - 02/24/19 0739    Visit Number  4    Date for PT Re-Evaluation  03/12/19    Authorization Type  BCBS    PT Start Time  [id number]   came late   PT Stop Time  0810    PT Time Calculation (min)  31 min    Activity Tolerance  Patient tolerated treatment well    Behavior During Therapy  [facility] for tasks assessed/performed       Past Medical History:  Diagnosis Date  . Fibroid   . Hx of varicella   . Vaginal Pap smear, abnormal     Past Surgical History:  Procedure Laterality Date  . CESAREAN SECTION N/A 11/23/2015   Procedure: Primary CESAREAN SECTION;  Surgeon: Servando Salina, MD;  Location: [place];  Service: Obstetrics;  Laterality: N/A;  EDD: 12/13/15  . LAPAROTOMY N/A 12/04/2013   Procedure: Exploratory Laparotomy with Evacuation Hematoma ;  Surgeon: Marvene Staff, MD;  Location: [facility];  Service: Gynecology;  Laterality: N/A;  . MYOMECTOMY N/A 12/03/2013   Procedure: Exploratory Laparotomy MYOMECTOMY;  Surgeon: Marvene Staff, MD;  Location: [facility];  Service: Gynecology;  Laterality: N/A;    There were no vitals filed for this visit.  Subjective Assessment - 02/24/19 0742    Subjective  I am sore after dry needling. I have to focus on being upright. I have been walking and bike riding.    Patient Stated Goals  poor posture    Currently in Pain?  No/denies         [facility] PT Assessment - 02/24/19 0001      AROM   Cervical Extension  50    Thoracic - Right Rotation  decreased by 10%                   [facility] Adult PT Treatment/Exercise - 02/24/19 0001      Neck Exercises:  Machines for Strengthening   UBE (Upper Arm Bike)  level 4 1 min every direction for 6 minutes   while assessing patient     Neck Exercises: Supine   Neck Retraction  5 reps;5 secs   laying on foam roll   Shoulder Flexion  Both;10 reps   with chin retraction   Other Supine Exercise  supine on foam roll at T3 level with chin retraction and hands behind head      Neck Exercises: Prone   Neck Retraction  10 reps   prone on elbows     Manual Therapy   Manual Therapy  Soft tissue mobilization;Joint mobilization    Joint Mobilization  sideglide to C4-C7 grade 3; supine chin retractionwiht cervical extension 5 times    Soft tissue mobilization  cervical paraspinals and subocciptials, SCM       Trigger Point Dry Needling - 02/24/19 0001    Consent Given?  Yes    Education Handout Provided  --   gave to patient previously   Muscles Treated Head and Neck  Cervical multifidi    Cervical multifidi Response  Twitch reponse elicited;Palpable increased muscle length             PT  Short Term Goals - 02/19/19 0806      PT SHORT TERM GOAL #1   Title  independent with initial HEP    Time  3    Status  Achieved        PT Long Term Goals - 02/24/19 0813      PT LONG TERM GOAL #1   Title  independent with HEP    Time  6    Period  Weeks    Status  On-going      PT LONG TERM GOAL #2   Title  improved cervical and thoracic ROM due to improved tissue mobility so she is able to demonstrate correct posture    Time  6    Period  Weeks    Status  Achieved      PT LONG TERM GOAL #3   Title  bilateral shoulder horizontal abduction >/= 5/5 so she is able to sit upright with reduction of cervical thoracic kyphosis    Time  6    Period  Weeks    Status  On-going            Plan - 02/24/19 0737    Clinical Impression Statement  Patient has less trigger points in the cervical paraspinals. Patient has increased cervical extension from 40 degrees to 50 degrees. Patient right  thoracic rotation has increased by 15%. Patient is able to stand with increased upper thoracic extension after therapy. Patient will benefit from skilled therapy to improve postureal strength, elongate tight muscles, and improve mobility.    Personal Factors and Comorbidities  Fitness;Profession    Stability/Clinical Decision Making  Stable/Uncomplicated    Rehab Potential  Excellent    PT Frequency  1x / week    PT Duration  6 weeks    PT Treatment/Interventions  Cryotherapy;Electrical Stimulation;Moist Heat;Therapeutic exercise;Therapeutic activities;Neuromuscular re-education;Patient/family education;Dry needling;Passive range of motion;Manual techniques    PT Next Visit Plan  joint mobilization to C7-T4; soft tissue work to the area; postural muscle strengthening; foam roll; pectoralis stretch; chin retraction with cervical extension    PT Home Exercise Plan  Access Code: [email or web address]    Consulted and Agree with Plan of Care  Patient       Patient will benefit from skilled therapeutic intervention in order to improve the following deficits and impairments:  Decreased range of motion, Increased fascial restricitons, Postural dysfunction, Decreased strength, Decreased mobility  Visit Diagnosis: Abnormal posture  Muscle weakness (generalized)     Problem List Patient Active Problem List   Diagnosis Date Noted  . Postpartum care following cesarean delivery (11/23/15) 11/23/2015  . S/P myomectomy 12/03/2013    Earlie Counts, PT 02/24/19 8:14 AM   [facility] [address], [place], [place], [address] Phone: [phone]   Fax:  [phone]  Name: Melanie [name] MRN: [id number] Date of Birth: [birth year]

## 2019-03-04 DIAGNOSIS — Z20828 Contact with and (suspected) exposure to other viral communicable diseases: Secondary | ICD-10-CM | POA: Diagnosis not present

## 2019-03-04 DIAGNOSIS — Z7189 Other specified counseling: Secondary | ICD-10-CM | POA: Diagnosis not present

## 2019-03-04 DIAGNOSIS — E669 Obesity, unspecified: Secondary | ICD-10-CM | POA: Diagnosis not present

## 2019-03-05 ENCOUNTER — Other Ambulatory Visit: Payer: Self-pay

## 2019-03-05 ENCOUNTER — Encounter: Payer: Self-pay | Admitting: Physical Therapy

## 2019-03-05 ENCOUNTER — Ambulatory Visit: Payer: Federal, State, Local not specified - PPO | Admitting: Physical Therapy

## 2019-03-05 DIAGNOSIS — R293 Abnormal posture: Secondary | ICD-10-CM | POA: Diagnosis not present

## 2019-03-05 DIAGNOSIS — M6281 Muscle weakness (generalized): Secondary | ICD-10-CM | POA: Diagnosis not present

## 2019-03-05 NOTE — Patient Instructions (Addendum)
Access Code: [email or web address]  URL: [email or web address]  Date: 03/05/2019  Prepared by: Earlie Counts   Exercises Seated Correct Posture - 1x daily - 7x weekly Neck Retraction - 5 reps - 1 sets - 1x daily - 7x weekly Doorway Pec Stretch at 90 Degrees Abduction - 1 reps - 1 sets - 30 sec hold - 2x daily - 7x weekly Doorway Pec Stretch at 120 Degrees Abduction - 1 reps - 1 sets - 30 sec hold - 2x daily - 7x weekly Seated Thoracic Lumbar Extension with Pectoralis Stretch - 2 reps - 1 sets - 5 sec hold - 2x daily - 7x weekly Standing Thoracic Spine Stretch - 2 reps - 1 sets - 15 sec hold - 2x daily - 7x weekly Seated Shoulder Diagonal with Resistance - 10 reps - 1 sets - 1x daily - 7x weekly Seated Shoulder Horizontal Abduction with Resistance - 10 reps - 1 sets - 1x daily - 7x weekly Shoulder W - External Rotation with Resistance - 10 reps - 1 sets - 1x daily - 7x weekly Prone Shoulder Horizontal Abduction with External Rotation - 10 reps - 1 sets - 1x daily - 7x weekly Prone Shoulder Horizontal Abduction - 10 reps - 1 sets - 1x daily - 7x weekly Prone Shoulder Flexion - 10 reps - 1 sets - 1x daily - 7x weekly Prone Scapular Retraction on Swiss Ball - 10 reps - 1 sets - 1x daily - 7x weekly Patient Education Office Posture Forward Head Posture Trigger Point Dry Needling [facility] [address] [place],  [address] Phone # [phone] Fax [phone]

## 2019-03-05 NOTE — Therapy (Signed)
[facility] [address] [place], [place], [address] Phone: [phone]   Fax:  [phone]  Physical Therapy Treatment  Patient Details  Name: Melanie [name] MRN: [id number] Date of Birth: [birth year] Referring Provider (PT): Dr. Grier Mitts   Encounter Date: 03/05/2019  PT End of Session - 03/05/19 1035    Visit Number  5    Date for PT Re-Evaluation  03/12/19    Authorization Type  BCBS    PT Start Time  0800    PT Stop Time  0840    PT Time Calculation (min)  40 min    Activity Tolerance  Patient tolerated treatment well    Behavior During Therapy  [facility] for tasks assessed/performed       Past Medical History:  Diagnosis Date  . Fibroid   . Hx of varicella   . Vaginal Pap smear, abnormal     Past Surgical History:  Procedure Laterality Date  . CESAREAN SECTION N/A 11/23/2015   Procedure: Primary CESAREAN SECTION;  Surgeon: Servando Salina, MD;  Location: [place];  Service: Obstetrics;  Laterality: N/A;  EDD: 12/13/15  . LAPAROTOMY N/A 12/04/2013   Procedure: Exploratory Laparotomy with Evacuation Hematoma ;  Surgeon: Marvene Staff, MD;  Location: [facility];  Service: Gynecology;  Laterality: N/A;  . MYOMECTOMY N/A 12/03/2013   Procedure: Exploratory Laparotomy MYOMECTOMY;  Surgeon: Marvene Staff, MD;  Location: [facility];  Service: Gynecology;  Laterality: N/A;    There were no vitals filed for this visit.  Subjective Assessment - 03/05/19 0812    Subjective  I got exercise at the beach. I am doing well.    Patient Stated Goals  poor posture    Currently in Pain?  No/denies         [facility] PT Assessment - 03/05/19 0001      Assessment   Medical Diagnosis  R29.3 Poor Posture    Referring Provider (PT)  Dr. Grier Mitts    Prior Therapy  none      Precautions   Precautions  None      Restrictions   Weight Bearing Restrictions  No      Prior Function   Level of  Independence  Independent    Vocation  Full time employment    Advertising account planner- sit and stand    Leisure  walk, Zumba      Cognition   Overall Cognitive Status  Within Functional Limits for tasks assessed      AROM   Cervical Extension  55    Thoracic - Right Rotation  full      Strength   Right Shoulder Horizontal ABduction  5/5    Left Shoulder Horizontal ABduction  5/5                   [facility] Adult PT Treatment/Exercise - 03/05/19 0001      Neck Exercises: Machines for Strengthening   UBE (Upper Arm Bike)  level 4 1 min every direction for 6 minutes   while assessing patient     Neck Exercises: Supine   Neck Retraction  5 reps;5 secs   laying on foam roll     Neck Exercises: Prone   Neck Retraction  10 reps   prone on elbows   Other Prone Exercise  alternate shoulder flexion on red physioball, then bilateral shoulder abduction 0#,1#,2#      Shoulder Exercises: Standing   Horizontal  ABduction  Strengthening;Both;15 reps;Theraband    Theraband Level (Shoulder Horizontal ABduction)  Level 3 (Green)    External Rotation  Strengthening;Both;15 reps;Theraband    Theraband Level (Shoulder External Rotation)  Level 3 (Green)    Extension  Strengthening;Both;15 reps;Theraband    Theraband Level (Shoulder Extension)  Level 2 (Red)             PT Education - 03/05/19 0842    Education Details  Access Code: [email or web address]    Person(s) Educated  Patient    Methods  Explanation;Demonstration;Verbal cues;Handout    Comprehension  Verbalized understanding;Returned demonstration       PT Short Term Goals - 02/19/19 0806      PT SHORT TERM GOAL #1   Title  independent with initial HEP    Time  3    Status  Achieved        PT Long Term Goals - 03/05/19 1037      PT LONG TERM GOAL #1   Title  independent with HEP    Time  6    Period  Weeks    Status  Achieved      PT LONG TERM GOAL #2   Title  improved cervical and thoracic ROM due to  improved tissue mobility so she is able to demonstrate correct posture    Time  6    Period  Weeks    Status  Achieved      PT LONG TERM GOAL #3   Title  bilateral shoulder horizontal abduction >/= 5/5 so she is able to sit upright with reduction of cervical thoracic kyphosis    Time  6    Period  Weeks    Status  Achieved            Plan - 03/05/19 0820    Clinical Impression Statement  Patient has full cervcial and thoracic ROM. Patient has met all of her goals. Patient has reduced cervical thoracic kyphosis. Patient understands correct posture and how to correct it. Patient is independent with HEP and understands how to progress herself.    Personal Factors and Comorbidities  Fitness;Profession    Stability/Clinical Decision Making  Stable/Uncomplicated    Rehab Potential  Excellent    PT Frequency  --    PT Duration  --    PT Treatment/Interventions  Cryotherapy;Electrical Stimulation;Moist Heat;Therapeutic exercise;Therapeutic activities;Neuromuscular re-education;Patient/family education;Dry needling;Passive range of motion;Manual techniques    PT Next Visit Plan  Discharge to HEP this visit.    PT Home Exercise Plan  Access Code: [email or web address]    Consulted and Agree with Plan of Care  Patient       Patient will benefit from skilled therapeutic intervention in order to improve the following deficits and impairments:  Decreased range of motion, Increased fascial restricitons, Postural dysfunction, Decreased strength, Decreased mobility  Visit Diagnosis: Abnormal posture  Muscle weakness (generalized)     Problem List Patient Active Problem List   Diagnosis Date Noted  . Postpartum care following cesarean delivery (11/23/15) 11/23/2015  . S/P myomectomy 12/03/2013    Earlie Counts, PT 03/05/19 10:38 AM   [facility] [address] [place], [place], [address] Phone: [phone]   Fax:   [phone]  Name: Melanie [name] MRN: [id number] Date of Birth: Apr 21, 1981  PHYSICAL THERAPY DISCHARGE SUMMARY  Visits from Start of Care: 5  Current functional level related to goals / functional outcomes: See above.    Remaining deficits: See above.  Education / Equipment: HEP Plan: Patient agrees to discharge.  Patient goals were met. Patient is being discharged due to meeting the stated rehab goals.  Thank you for the referral. Earlie Counts, PT 03/05/19 10:39 AM  ?????

## 2019-03-12 ENCOUNTER — Ambulatory Visit: Payer: Federal, State, Local not specified - PPO | Admitting: Physical Therapy

## 2019-03-16 ENCOUNTER — Other Ambulatory Visit: Payer: Self-pay

## 2019-03-16 DIAGNOSIS — Z20828 Contact with and (suspected) exposure to other viral communicable diseases: Secondary | ICD-10-CM | POA: Diagnosis not present

## 2019-03-16 DIAGNOSIS — Z20822 Contact with and (suspected) exposure to covid-19: Secondary | ICD-10-CM

## 2019-03-18 LAB — NOVEL CORONAVIRUS, NAA: SARS-CoV-2, NAA: NOT DETECTED

## 2019-04-08 DIAGNOSIS — Z7189 Other specified counseling: Secondary | ICD-10-CM | POA: Diagnosis not present

## 2019-04-08 DIAGNOSIS — Z20828 Contact with and (suspected) exposure to other viral communicable diseases: Secondary | ICD-10-CM | POA: Diagnosis not present

## 2019-06-16 DIAGNOSIS — Z03818 Encounter for observation for suspected exposure to other biological agents ruled out: Secondary | ICD-10-CM | POA: Diagnosis not present

## 2019-07-22 DIAGNOSIS — Z03818 Encounter for observation for suspected exposure to other biological agents ruled out: Secondary | ICD-10-CM | POA: Diagnosis not present

## 2019-11-16 DIAGNOSIS — Z1151 Encounter for screening for human papillomavirus (HPV): Secondary | ICD-10-CM | POA: Diagnosis not present

## 2019-11-16 DIAGNOSIS — Z1231 Encounter for screening mammogram for malignant neoplasm of breast: Secondary | ICD-10-CM | POA: Diagnosis not present

## 2019-11-16 DIAGNOSIS — Z01419 Encounter for gynecological examination (general) (routine) without abnormal findings: Secondary | ICD-10-CM | POA: Diagnosis not present

## 2019-11-16 DIAGNOSIS — Z3200 Encounter for pregnancy test, result unknown: Secondary | ICD-10-CM | POA: Diagnosis not present

## 2019-11-16 DIAGNOSIS — Z6833 Body mass index (BMI) 33.0-33.9, adult: Secondary | ICD-10-CM | POA: Diagnosis not present

## 2019-11-16 LAB — HM MAMMOGRAPHY

## 2019-12-02 ENCOUNTER — Encounter: Payer: Self-pay | Admitting: Family Medicine

## 2019-12-06 DIAGNOSIS — K080 Exfoliation of teeth due to systemic causes: Secondary | ICD-10-CM | POA: Diagnosis not present

## 2020-01-28 DIAGNOSIS — Z03818 Encounter for observation for suspected exposure to other biological agents ruled out: Secondary | ICD-10-CM | POA: Diagnosis not present

## 2020-02-10 DIAGNOSIS — Z03818 Encounter for observation for suspected exposure to other biological agents ruled out: Secondary | ICD-10-CM | POA: Diagnosis not present

## 2020-02-24 DIAGNOSIS — Z03818 Encounter for observation for suspected exposure to other biological agents ruled out: Secondary | ICD-10-CM | POA: Diagnosis not present

## 2020-04-07 ENCOUNTER — Encounter: Payer: Self-pay | Admitting: Family Medicine

## 2020-04-07 ENCOUNTER — Ambulatory Visit: Payer: Federal, State, Local not specified - PPO | Admitting: Family Medicine

## 2020-04-07 ENCOUNTER — Other Ambulatory Visit: Payer: Self-pay

## 2020-04-07 VITALS — BP 110/64 | HR 70 | Temp 97.9°F | Wt 242.8 lb

## 2020-04-07 DIAGNOSIS — H66002 Acute suppurative otitis media without spontaneous rupture of ear drum, left ear: Secondary | ICD-10-CM | POA: Diagnosis not present

## 2020-04-07 DIAGNOSIS — T148XXA Other injury of unspecified body region, initial encounter: Secondary | ICD-10-CM | POA: Diagnosis not present

## 2020-04-07 DIAGNOSIS — M40202 Unspecified kyphosis, cervical region: Secondary | ICD-10-CM

## 2020-04-07 DIAGNOSIS — H6982 Other specified disorders of Eustachian tube, left ear: Secondary | ICD-10-CM

## 2020-04-07 DIAGNOSIS — M7989 Other specified soft tissue disorders: Secondary | ICD-10-CM | POA: Diagnosis not present

## 2020-04-07 MED ORDER — AMOXICILLIN-POT CLAVULANATE 875-125 MG PO TABS: 1.0000 | ORAL_TABLET | Freq: Two times a day (BID) | ORAL | 0 refills | Status: AC

## 2020-04-07 MED ORDER — FLUTICASONE PROPIONATE 50 MCG/ACT NA SUSP: 1.0000 | Freq: Every day | NASAL | 0 refills | Status: DC

## 2020-04-07 NOTE — Progress Notes (Signed)
Subjective:    Patient ID: Melanie [name], female    DOB: 29-Mar-1981, 39 y.o.   MRN: [id number]  No chief complaint on file.   HPI Patient was seen today for ongoing concerns.  Patient endorses left ear pain 2-3 weeks.  Patient states symptoms started shortly after returning from a trip to the mountains.  Patient endorses her ear had difficulty popping.  Pt endorses seeing drainage and whitish wax from the ear over the last few wks.  Pt concerned as she has a trip to [place] in the next few wks.  Pt notes a bump on her L ring finger.  Present x 2 months.  Non painful, nonpruritic, or erythematous.  Pt denies changes in soaps, lotions, or detergents.  No other bumps noted.  Pt takes her wedding rings off nightly as she developed a dermatitis in the past.  Pt inquires about ways to help the curvature in her cervical spine causing a a hump in the base of her neck.  Pt notes her mother has a similar condition.  Complete physical therapy and does exercises at home occasionally.  Patient trying to increase activity to lose weight, but notes difficulty getting started.  [facility] require a [facility] be worn when exercising.  Past Medical History:  Diagnosis Date  . Fibroid   . Hx of varicella   . Vaginal Pap smear, abnormal     Allergies  Allergen Reactions  . Latex Itching    Gets UTI's from latex condoms  . Nickel     Skin irritation    ROS General: Denies fever, chills, night sweats, changes in weight, changes in appetite HEENT: Denies headaches, ear pain, changes in vision, rhinorrhea, sore throat  + left ear pain CV: Denies CP, palpitations, SOB, orthopnea Pulm: Denies SOB, cough, wheezing GI: Denies abdominal pain, nausea, vomiting, diarrhea, constipation GU: Denies dysuria, hematuria, frequency, vaginal discharge Msk: Denies muscle cramps, joint pains+ forward curvature of cervical spine Neuro: Denies weakness, numbness, tingling Skin: Denies rashes,  bruising  + bump on left ring finger Psych: Denies depression, anxiety, hallucinations      Objective:    Blood pressure 110/64, pulse 70, temperature 97.9 F (36.6 C), temperature source Oral, weight 242 lb 12.8 oz (110.1 kg), SpO2 99 %, unknown if currently breastfeeding.   Gen. Pleasant, well-nourished, in no distress, normal affect   HEENT: [place]/AT, face symmetric, conjunctiva clear, no scleral icterus, PERRLA, EOMI, nares patent without drainage, right external ear, canal, and TM normal.  L external ear and canal normal.  L TM intact,  Full, with suppurative fluid. Lungs: no accessory muscle use, CTAB, no wheezes or rales Cardiovascular: RRR, no m/r/g, no peripheral edema Abdomen: BS present, soft, NT/ND, no hepatosplenomegaly. Musculoskeletal: Mild kyphosis of cervical spine, improves with adjusting posture.  No cyanosis or clubbing, normal tone Neuro:  A&Ox3, CN II-XII intact, normal gait Skin:  Warm, no lesions/ rash.  L 4th digit with a vesicle on medial side at PIP joint and a firm slightly mobile nodule on lateral side.  No erythema, drainage, or induration.   Wt Readings from Last 3 Encounters:  04/07/20 242 lb 12.8 oz (110.1 kg)  11/10/15 254 lb (115.2 kg)  11/09/15 254 lb (115.2 kg)    Lab Results  Component Value Date   WBC 13.7 (H) 11/24/2015   HGB 10.4 (L) 11/24/2015   HCT 31.5 (L) 11/24/2015   PLT 201 11/24/2015    Assessment/Plan:  Acute  suppurative otitis media of left ear without spontaneous rupture of tympanic membrane, recurrence not specified  -continue supportive care including NSAIDs prn - Plan: amoxicillin-clavulanate (AUGMENTIN) 875-125 MG tablet  Dysfunction of left eustachian tube  - Plan: fluticasone (FLONASE) 50 MCG/ACT nasal spray  Calcification of soft tissue -discussed medial nodule on 5th digit possibly a calcification.  Consider a ganglion cyst though feels more firm. -will continue to monitor. -for increased symptoms consider referral  to Dermatology  Blister -likely worsened by friction of rings against finger upon removal nightly. -discussed supportive care.  Consider wearing a stretchable sports type wedding band for the next few wks. -continue to monitor. -consider Derm referral  Kyphosis of cervical region, unspecified kyphosis type -discussed exercises for posture/continued PT -advised to consider xray as not previously done.  F/u prn.  Pt to schedule CPE in the next few wks.  Grier Mitts, MD

## 2020-04-07 NOTE — Patient Instructions (Signed)
Otitis Media, Adult  Otitis media occurs when there is inflammation and fluid in the middle ear. Your middle ear is a part of the ear that contains bones for hearing as well as air that helps send sounds to your brain. What are the causes? This condition is caused by a blockage in the eustachian tube. This tube drains fluid from the ear to the back of the nose (nasopharynx). A blockage in this tube can be caused by an object or by swelling (edema) in the tube. Problems that can cause a blockage include:  A cold or other upper respiratory infection.  Allergies.  An irritant, such as tobacco smoke.  Enlarged adenoids. The adenoids are areas of soft tissue located high in the back of the throat, behind the nose and the roof of the mouth.  A mass in the nasopharynx.  Damage to the ear caused by pressure changes (barotrauma). What are the signs or symptoms? Symptoms of this condition include:  Ear pain.  A fever.  Decreased hearing.  A headache.  Tiredness (lethargy).  Fluid leaking from the ear.  Ringing in the ear. How is this diagnosed? This condition is diagnosed with a physical exam. During the exam your health care provider will use an instrument called an otoscope to look into your ear and check for redness, swelling, and fluid. He or she will also ask about your symptoms. Your health care provider may also order tests, such as:  A test to check the movement of the eardrum (pneumatic otoscopy). This test is done by squeezing a small amount of air into the ear.  A test that changes air pressure in the middle ear to check how well the eardrum moves and whether the eustachian tube is working (tympanogram). How is this treated? This condition usually goes away on its own within 3-5 days. But if the condition is caused by a bacteria infection and does not go away own its own, or keeps coming back, your health care provider may:  Prescribe antibiotic medicines to treat the  infection.  Prescribe or recommend medicines to control pain. Follow these instructions at home:  Take over-the-counter and prescription medicines only as told by your health care provider.  If you were prescribed an antibiotic medicine, take it as told by your health care provider. Do not stop taking the antibiotic even if you start to feel better.  Keep all follow-up visits as told by your health care provider. This is important. Contact a health care provider if:  You have bleeding from your nose.  There is a lump on your neck.  You are not getting better in 5 days.  You feel worse instead of better. Get help right away if:  You have severe pain that is not controlled with medicine.  You have swelling, redness, or pain around your ear.  You have stiffness in your neck.  A part of your face is paralyzed.  The bone behind your ear (mastoid) is tender when you touch it.  You develop a severe headache. Summary  Otitis media is redness, soreness, and swelling of the middle ear.  This condition usually goes away on its own within 3-5 days.  If the problem does not go away in 3-5 days, your health care provider may prescribe or recommend medicines to treat your symptoms.  If you were prescribed an antibiotic medicine, take it as told by your health care provider. This information is not intended to replace advice given to you by   your health care provider. Make sure you discuss any questions you have with your health care provider. Document Revised: 05/09/2017 Document Reviewed: 05/17/2016 Elsevier Patient Education  [place].  Eustachian Tube Dysfunction  Eustachian tube dysfunction refers to a condition in which a blockage develops in the narrow passage that connects the middle ear to the back of the nose (eustachian tube). The eustachian tube regulates air pressure in the middle ear by letting air move between the ear and nose. It also helps to drain fluid from  the middle ear space. Eustachian tube dysfunction can affect one or both ears. When the eustachian tube does not function properly, air pressure, fluid, or both can build up in the middle ear. What are the causes? This condition occurs when the eustachian tube becomes blocked or cannot open normally. Common causes of this condition include:  Ear infections.  Colds and other infections that affect the nose, mouth, and throat (upper respiratory tract).  Allergies.  Irritation from cigarette smoke.  Irritation from stomach acid coming up into the esophagus (gastroesophageal reflux). The esophagus is the tube that carries food from the mouth to the stomach.  Sudden changes in air pressure, such as from descending in an airplane or scuba diving.  Abnormal growths in the nose or throat, such as: ? Growths that line the nose (nasal polyps). ? Abnormal growth of cells (tumors). ? Enlarged tissue at the back of the throat (adenoids). What increases the risk? You are more likely to develop this condition if:  You smoke.  You are overweight.  You are a child who has: ? Certain birth defects of the mouth, such as cleft palate. ? Large tonsils or adenoids. What are the signs or symptoms? Common symptoms of this condition include:  A feeling of fullness in the ear.  Ear pain.  Clicking or popping noises in the ear.  Ringing in the ear.  Hearing loss.  Loss of balance.  Dizziness. Symptoms may get worse when the air pressure around you changes, such as when you travel to an area of high elevation, fly on an airplane, or go scuba diving. How is this diagnosed? This condition may be diagnosed based on:  Your symptoms.  A physical exam of your ears, nose, and throat.  Tests, such as those that measure: ? The movement of your eardrum (tympanogram). ? Your hearing (audiometry). How is this treated? Treatment depends on the cause and severity of your condition.  In mild cases,  you may relieve your symptoms by moving air into your ears. This is called "popping the ears."  In more severe cases, or if you have symptoms of fluid in your ears, treatment may include: ? Medicines to relieve congestion (decongestants). ? Medicines that treat allergies (antihistamines). ? Nasal sprays or ear drops that contain medicines that reduce swelling (steroids). ? A procedure to drain the fluid in your eardrum (myringotomy). In this procedure, a small tube is placed in the eardrum to:  Drain the fluid.  Restore the air in the middle ear space. ? A procedure to insert a balloon device through the nose to inflate the opening of the eustachian tube (balloon dilation). Follow these instructions at home: Lifestyle  Do not do any of the following until your health care provider approves: ? Travel to high altitudes. ? Fly in airplanes. ? Work in a Pension scheme manager or room. ? Scuba dive.  Do not use any products that contain nicotine or tobacco, such as cigarettes and e-cigarettes.  If you need help quitting, ask your health care provider.  Keep your ears dry. Wear fitted earplugs during showering and bathing. Dry your ears completely after. General instructions  Take over-the-counter and prescription medicines only as told by your health care provider.  Use techniques to help pop your ears as recommended by your health care provider. These may include: ? Chewing gum. ? Yawning. ? Frequent, forceful swallowing. ? Closing your mouth, holding your nose closed, and gently blowing as if you are trying to blow air out of your nose.  Keep all follow-up visits as told by your health care provider. This is important. Contact a health care provider if:  Your symptoms do not go away after treatment.  Your symptoms come back after treatment.  You are unable to pop your ears.  You have: ? A fever. ? Pain in your ear. ? Pain in your head or neck. ? Fluid draining from your  ear.  Your hearing suddenly changes.  You become very dizzy.  You lose your balance. Summary  Eustachian tube dysfunction refers to a condition in which a blockage develops in the eustachian tube.  It can be caused by ear infections, allergies, inhaled irritants, or abnormal growths in the nose or throat.  Symptoms include ear pain, hearing loss, or ringing in the ears.  Mild cases are treated with maneuvers to unblock the ears, such as yawning or ear popping.  Severe cases are treated with medicines. Surgery may also be done (rare). This information is not intended to replace advice given to you by your health care provider. Make sure you discuss any questions you have with your health care provider. Document Revised: 09/16/2017 Document Reviewed: 09/16/2017 Elsevier Patient Education  [place].

## 2020-04-09 ENCOUNTER — Encounter: Payer: Self-pay | Admitting: Family Medicine

## 2020-04-09 DIAGNOSIS — M40202 Unspecified kyphosis, cervical region: Secondary | ICD-10-CM | POA: Insufficient documentation

## 2020-04-11 DIAGNOSIS — Z20822 Contact with and (suspected) exposure to covid-19: Secondary | ICD-10-CM | POA: Diagnosis not present

## 2020-04-11 DIAGNOSIS — Z03818 Encounter for observation for suspected exposure to other biological agents ruled out: Secondary | ICD-10-CM | POA: Diagnosis not present

## 2020-06-06 ENCOUNTER — Other Ambulatory Visit: Payer: Federal, State, Local not specified - PPO

## 2020-06-08 ENCOUNTER — Other Ambulatory Visit: Payer: Federal, State, Local not specified - PPO

## 2020-06-27 DIAGNOSIS — Z1152 Encounter for screening for COVID-19: Secondary | ICD-10-CM | POA: Diagnosis not present

## 2020-10-05 ENCOUNTER — Other Ambulatory Visit: Payer: Self-pay

## 2020-10-06 ENCOUNTER — Encounter: Payer: Federal, State, Local not specified - PPO | Admitting: Family Medicine

## 2020-10-13 ENCOUNTER — Other Ambulatory Visit: Payer: Self-pay

## 2020-10-13 ENCOUNTER — Encounter: Payer: Self-pay | Admitting: Family Medicine

## 2020-10-13 ENCOUNTER — Ambulatory Visit (INDEPENDENT_AMBULATORY_CARE_PROVIDER_SITE_OTHER): Payer: Federal, State, Local not specified - PPO | Admitting: Family Medicine

## 2020-10-13 VITALS — BP 122/84 | HR 86 | Temp 98.4°F | Ht 72.0 in | Wt 249.0 lb

## 2020-10-13 DIAGNOSIS — H0520 Unspecified exophthalmos: Secondary | ICD-10-CM

## 2020-10-13 DIAGNOSIS — R2232 Localized swelling, mass and lump, left upper limb: Secondary | ICD-10-CM | POA: Diagnosis not present

## 2020-10-13 DIAGNOSIS — Z13220 Encounter for screening for lipoid disorders: Secondary | ICD-10-CM

## 2020-10-13 DIAGNOSIS — Z131 Encounter for screening for diabetes mellitus: Secondary | ICD-10-CM | POA: Diagnosis not present

## 2020-10-13 DIAGNOSIS — R635 Abnormal weight gain: Secondary | ICD-10-CM

## 2020-10-13 DIAGNOSIS — Z0000 Encounter for general adult medical examination without abnormal findings: Secondary | ICD-10-CM

## 2020-10-13 DIAGNOSIS — E041 Nontoxic single thyroid nodule: Secondary | ICD-10-CM | POA: Diagnosis not present

## 2020-10-13 DIAGNOSIS — Z1159 Encounter for screening for other viral diseases: Secondary | ICD-10-CM

## 2020-10-13 DIAGNOSIS — M40202 Unspecified kyphosis, cervical region: Secondary | ICD-10-CM | POA: Diagnosis not present

## 2020-10-13 LAB — CBC WITH DIFFERENTIAL/PLATELET
Basophils Absolute: 0 10*3/uL (ref 0.0–0.1)
Basophils Relative: 0.6 % (ref 0.0–3.0)
Eosinophils Absolute: 0 10*3/uL (ref 0.0–0.7)
Eosinophils Relative: 0.2 % (ref 0.0–5.0)
HCT: 36.3 % (ref 36.0–46.0)
Hemoglobin: 11.6 g/dL — ABNORMAL LOW (ref 12.0–15.0)
Lymphocytes Relative: 30.7 % (ref 12.0–46.0)
Lymphs Abs: 2.5 10*3/uL (ref 0.7–4.0)
MCHC: 31.8 g/dL (ref 30.0–36.0)
MCV: 78.2 fl (ref 78.0–100.0)
Monocytes Absolute: 0.5 10*3/uL (ref 0.1–1.0)
Monocytes Relative: 5.7 % (ref 3.0–12.0)
Neutro Abs: 5 10*3/uL (ref 1.4–7.7)
Neutrophils Relative %: 62.8 % (ref 43.0–77.0)
Platelets: 331 10*3/uL (ref 150.0–400.0)
RBC: 4.64 Mil/uL (ref 3.87–5.11)
RDW: 15.5 % (ref 11.5–15.5)
WBC: 8 10*3/uL (ref 4.0–10.5)

## 2020-10-13 LAB — COMPREHENSIVE METABOLIC PANEL
ALT: 15 U/L (ref 0–35)
AST: 13 U/L (ref 0–37)
Albumin: 4.3 g/dL (ref 3.5–5.2)
Alkaline Phosphatase: 66 U/L (ref 39–117)
BUN: 10 mg/dL (ref 6–23)
CO2: 27 mEq/L (ref 19–32)
Calcium: 9.5 mg/dL (ref 8.4–10.5)
Chloride: 103 mEq/L (ref 96–112)
Creatinine, Ser: 0.71 mg/dL (ref 0.40–1.20)
GFR: 105.9 mL/min (ref >60.00)
Glucose, Bld: 91 mg/dL (ref 70–99)
Potassium: 4.6 mEq/L (ref 3.5–5.1)
Sodium: 137 mEq/L (ref 135–145)
Total Bilirubin: 0.5 mg/dL (ref 0.2–1.2)
Total Protein: 7 g/dL (ref 6.0–8.3)

## 2020-10-13 LAB — VITAMIN B12: Vitamin B-12: 210 pg/mL — ABNORMAL LOW (ref 211–911)

## 2020-10-13 LAB — LIPID PANEL
Cholesterol: 191 mg/dL (ref 0–200)
HDL: 58.8 mg/dL (ref >39.00)
LDL Cholesterol: 117 mg/dL — ABNORMAL HIGH (ref 0–99)
NonHDL: 132.09
Total CHOL/HDL Ratio: 3
Triglycerides: 74 mg/dL (ref 0.0–149.0)
VLDL: 14.8 mg/dL (ref 0.0–40.0)

## 2020-10-13 LAB — VITAMIN D 25 HYDROXY (VIT D DEFICIENCY, FRACTURES): VITD: 22.43 ng/mL — ABNORMAL LOW (ref 30.00–100.00)

## 2020-10-13 LAB — T4, FREE: Free T4: 0.78 ng/dL (ref 0.60–1.60)

## 2020-10-13 LAB — TSH: TSH: 1.94 u[IU]/mL (ref 0.35–4.50)

## 2020-10-13 LAB — HEMOGLOBIN A1C: Hgb A1c MFr Bld: 5.9 % (ref 4.6–6.5)

## 2020-10-13 NOTE — Progress Notes (Signed)
Subjective:     Melanie [name] is a 40 y.o. female and is here for a comprehensive physical exam. Pt has been doing well since last OFV. Pt completed PT for kyphosis.  Still doing exercises at home and with personal trainer 2 times per week to improve posture and strength in neck.  Pt trying to lose weight.  Has dinner around 6 or 6:30 PM but may have a snack around 9 PM.  Trying to eat 3 meals per day.  Drinking mostly water in a cup of coffee in the morning.  Patient notes exophthalmos times several years.  States her father's eyes are similar.  Patient denies changes in vision constipation, diarrhea changes in hair texture or skin.  Wears contacts.  Pt notes nodules on side of the left ring finger still present, slightly smaller in size than at last visit.  Patient inquires about dermatology referral possible hair removal.  Followed by OB/GYN.  Pt states work is going well.  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  Pt has a young son.  Family History  Problem Relation Age of Onset  . Cancer Father        prostate  . Diabetes Father   . Heart disease Father   . Cancer Paternal Grandmother        colon  . Diabetes Paternal Grandmother     Social History   Socioeconomic History  . Marital status: Married    Spouse name: Not on file  . Number of children: Not on file  . Years of education: Not on file  . Highest education level: Not on file  Occupational History  . Not on file  Tobacco Use  . Smoking status: Never Smoker  . Smokeless tobacco: Never Used  Substance and Sexual Activity  . Alcohol use: Yes    Comment: socially  . Drug use: No  . Sexual activity: Yes    Birth control/protection: None  Other Topics Concern  . Not on file  Social History Narrative  . Not on file   Social Determinants of Health   Financial Resource Strain: Not on file  Food Insecurity: Not on file  Transportation Needs: Not on file  Physical Activity: Not on file  Stress: Not  on file  Social Connections: Not on file  Intimate Partner Violence: Not on file   Health Maintenance  Topic Date Due  . Hepatitis C Screening  Never done  . TETANUS/TDAP  Never done  . PAP SMEAR-Modifier  Never done  . COVID-19 Vaccine (2 - Booster for [employer] series) 05/30/2020  . INFLUENZA VACCINE  01/08/2021  . HIV Screening  Completed  . HPV VACCINES  Aged Out    The following portions of the patient's history were reviewed and updated as appropriate: allergies, current medications, past family history, past medical history, past social history, past surgical history and problem list.  Review of Systems Pertinent items noted in HPI and remainder of comprehensive ROS otherwise negative.   Objective:    BP 122/84 (BP Location: Left Arm, Patient Position: Sitting, Cuff Size: Normal)   Pulse 86   Temp 98.4 F (36.9 C) (Oral)   Ht 6' (1.829 m)   Wt 249 lb (112.9 kg)   LMP 09/20/2020   SpO2 97%   BMI 33.77 kg/m  General appearance: alert, cooperative and no distress Head: Normocephalic, without obvious abnormality, atraumatic Eyes: Exophthalmos, conjunctivae/corneas clear. PERRL, EOM's intact. Fundi benign. Ears: normal TM's and  external ear canals both ears Nose: Nares normal. Septum midline. Mucosa normal. No drainage or sinus tenderness. Throat: lips, mucosa, and tongue normal; teeth and gums normal Neck: no adenopathy, no carotid bruit, no JVD, supple, symmetrical, trachea midline and symmetric goiter, 5 mm nodule appreciated in L lobe of thyroid. Lungs: clear to auscultation bilaterally Heart: regular rate and rhythm, S1, S2 normal, no murmur, click, rub or gallop Abdomen: soft, non-tender; bowel sounds normal; no masses,  no organomegaly Extremities/MSK: Kyphosis, extremities normal, atraumatic, no cyanosis or edema Pulses: 2+ and symmetric Skin: skin color, texture, and turgor normal.  Mobile nodule on L 4 th digit on each side of PIP joint.   Lymph nodes: Cervical,  supraclavicular, and axillary nodes normal. Neurologic: Alert and oriented X 3, normal strength and tone. Normal symmetric reflexes. Normal coordination and gait    Assessment:    Healthy female exam with a left lobe thyroid nodule, nodules on left fourth digit, kyphosis, exophthalmos.     Plan:     Anticipatory guidance given including wearing seatbelts, smoke detectors in the home, increasing physical activity, increasing p.o. intake of water and vegetables. -We will obtain labs -Mammogram up-to-date, done 11/16/2019 -Pap done by OB/GYN -Given handout -Next CPE in 1 year See After Visit Summary for Counseling Recommendations    Thyroid nodule  -5 mm nodule appreciated in left lobe thyroid on exam -We will obtain ultrasound and labs for further evaluation - Plan: TSH, T4, Free, US THYROID  Kyphosis of cervical region, unspecified kyphosis type -s/p PT -Continue daily exercises/stretching -Ergonomic modifications to workspace  Nodule of finger of left hand -improved since last OFV. -Possible calcification versus ganglion cyst -Discussed referral to hand surgery.  Patient wishes to continue observing -Will wait on referral at this time.  Exophthalmos of both eyes -No changes, noted since childhood -Continue regular eye exam -Plan: TSH, free T4  Weight gain  -lifestyle modifications -continue exercise with trainer -Discussed portion control, decreasing calories/carbs, etc -Given handout - Plan: TSH, T4, Free, Hemoglobin A1c, Lipid panel, Vitamin B12, Vitamin D, 25-hydroxy  Screening for cholesterol level -Plan: Lipid panel  Screening for diabetes mellitus (DM) -Plan: Hemoglobin A1c  Encounter for hepatitis C screening test for low risk patient - Plan: Hep C Antibody  F/u prn   Grier Mitts, MD

## 2020-10-13 NOTE — Patient Instructions (Signed)
Preventive Care 41-41 Years Old, Female Preventive care refers to lifestyle choices and visits with your health care provider that can promote health and wellness. This includes:  A yearly physical exam. This is also called an annual wellness visit.  Regular dental and eye exams.  Immunizations.  Screening for certain conditions.  Healthy lifestyle choices, such as: ? Eating a healthy diet. ? Getting regular exercise. ? Not using drugs or products that contain nicotine and tobacco. ? Limiting alcohol use. What can I expect for my preventive care visit? Physical exam Your health care provider will check your:  Height and weight. These may be used to calculate your BMI (body mass index). BMI is a measurement that tells if you are at a healthy weight.  Heart rate and blood pressure.  Body temperature.  Skin for abnormal spots. Counseling Your health care provider may ask you questions about your:  Past medical problems.  Family's medical history.  Alcohol, tobacco, and drug use.  Emotional well-being.  Home life and relationship well-being.  Sexual activity.  Diet, exercise, and sleep habits.  Work and work Statistician.  Access to firearms.  Method of birth control.  Menstrual cycle.  Pregnancy history. What immunizations do I need? Vaccines are usually given at various ages, according to a schedule. Your health care provider will recommend vaccines for you based on your age, medical history, and lifestyle or other factors, such as travel or where you work.   What tests do I need? Blood tests  Lipid and cholesterol levels. These may be checked every 5 years, or more often if you are over 3 years old.  Hepatitis C test.  Hepatitis B test. Screening  Lung cancer screening. You may have this screening every year starting at age 73 if you have a 30-pack-year history of smoking and currently smoke or have quit within the past 15 years.  Colorectal cancer  screening. ? All adults should have this screening starting at age 52 and continuing until age 17. ? Your health care provider may recommend screening at age 49 if you are at increased risk. ? You will have tests every 1-10 years, depending on your results and the type of screening test.  Diabetes screening. ? This is done by checking your blood sugar (glucose) after you have not eaten for a while (fasting). ? You may have this done every 1-3 years.  Mammogram. ? This may be done every 1-2 years. ? Talk with your health care provider about when you should start having regular mammograms. This may depend on whether you have a family history of breast cancer.  BRCA-related cancer screening. This may be done if you have a family history of breast, ovarian, tubal, or peritoneal cancers.  Pelvic exam and Pap test. ? This may be done every 3 years starting at age 10. ? Starting at age 11, this may be done every 5 years if you have a Pap test in combination with an HPV test. Other tests  STD (sexually transmitted disease) testing, if you are at risk.  Bone density scan. This is done to screen for osteoporosis. You may have this scan if you are at high risk for osteoporosis. Talk with your health care provider about your test results, treatment options, and if necessary, the need for more tests. Follow these instructions at home: Eating and drinking  Eat a diet that includes fresh fruits and vegetables, whole grains, lean protein, and low-fat dairy products.  Take vitamin and mineral supplements  as recommended by your health care provider.  Do not drink alcohol if: ? Your health care provider tells you not to drink. ? You are pregnant, may be pregnant, or are planning to become pregnant.  If you drink alcohol: ? Limit how much you have to 0-1 drink a day. ? Be aware of how much alcohol is in your drink. In the [place] one drink equals one 12 oz bottle of beer (355 mL), one 5 oz glass of  wine (148 mL), or one 1 oz glass of hard liquor (44 mL).   Lifestyle  Take daily care of your teeth and gums. Brush your teeth every morning and night with fluoride toothpaste. Floss one time each day.  Stay active. Exercise for at least 30 minutes 5 or more days each week.  Do not use any products that contain nicotine or tobacco, such as cigarettes, e-cigarettes, and chewing tobacco. If you need help quitting, ask your health care provider.  Do not use drugs.  If you are sexually active, practice safe sex. Use a condom or other form of protection to prevent STIs (sexually transmitted infections).  If you do not wish to become pregnant, use a form of birth control. If you plan to become pregnant, see your health care provider for a prepregnancy visit.  If told by your health care provider, take low-dose aspirin daily starting at age 73.  Find healthy ways to cope with stress, such as: ? Meditation, yoga, or listening to music. ? Journaling. ? Talking to a trusted person. ? Spending time with friends and family. Safety  Always wear your seat belt while driving or riding in a vehicle.  Do not drive: ? If you have been drinking alcohol. Do not ride with someone who has been drinking. ? When you are tired or distracted. ? While texting.  Wear a helmet and other protective equipment during sports activities.  If you have firearms in your house, make sure you follow all gun safety procedures. What's next?  Visit your health care provider once a year for an annual wellness visit.  Ask your health care provider how often you should have your eyes and teeth checked.  Stay up to date on all vaccines. This information is not intended to replace advice given to you by your health care provider. Make sure you discuss any questions you have with your health care provider. Document Revised: 02/29/2020 Document Reviewed: 02/05/2018 Elsevier Patient Education  2021 [place].  Preventing Unhealthy [employer], Adult Staying at a healthy weight is important to your overall health. When fat builds up in your body, you may become overweight or obese. Being overweight or obese increases your risk of developing certain health problems, such as heart disease, diabetes, sleeping problems, joint problems, and some types of cancer. Unhealthy weight gain is often the result of making unhealthy food choices or not getting enough exercise. You can make changes to your lifestyle to prevent obesity and stay as healthy as possible. What nutrition changes can be made?  Eat only as much as your body needs. To do this: ? Pay attention to signs that you are hungry or full. Stop eating as soon as you feel full. ? If you feel hungry, try drinking water first before eating. Drink enough water so your urine is clear or pale yellow. ? Eat smaller portions. Pay attention to portion sizes when eating out. ? Look at serving sizes on food labels. Most foods contain more than one  serving per container. ? Eat the recommended number of calories for your gender and activity level. For most active people, a daily total of 2,000 calories is appropriate. If you are trying to lose weight or are not very active, you may need to eat fewer calories. Talk with your health care provider or a diet and nutrition specialist (dietitian) about how many calories you need each day.  Choose healthy foods, such as: ? Fruits and vegetables. At each meal, try to fill at least half of your plate with fruits and vegetables. ? Whole grains, such as whole-wheat bread, brown rice, and quinoa. ? Lean meats, such as chicken or fish. ? Other healthy proteins, such as beans, eggs, or tofu. ? Healthy fats, such as nuts, seeds, fatty fish, and olive oil. ? Low-fat or fat-free dairy products.  Check food labels, and avoid food and drinks that: ? Are high in calories. ? Have added sugar. ? Are high in sodium. ? Have  saturated fats or trans fats.  Cook foods in healthier ways, such as by baking, broiling, or grilling.  Make a meal plan for the week, and shop with a grocery list to help you stay on track with your purchases. Try to avoid going to the grocery store when you are hungry.  When grocery shopping, try to shop around the outside of the store first, where the fresh foods are. Doing this helps you to avoid prepackaged foods, which can be high in sugar, salt (sodium), and fat.   What lifestyle changes can be made?  Exercise for 30 or more minutes on 5 or more days each week. Exercising may include brisk walking, yard work, biking, running, swimming, and team sports like basketball and soccer. Ask your health care provider which exercises are safe for you.  Do muscle-strengthening activities, such as lifting weights or using resistance bands, on 2 or more days a week.  Do not use any products that contain nicotine or tobacco, such as cigarettes and e-cigarettes. If you need help quitting, ask your health care provider.  Limit alcohol intake to no more than 1 drink a day for nonpregnant women and 2 drinks a day for men. One drink equals 12 oz of beer, 5 oz of wine, or 1 oz of hard liquor.  Try to get 7-9 hours of sleep each night.   What other changes can be made?  Keep a food and activity journal to keep track of: ? What you ate and how many calories you had. Remember to count the calories in sauces, dressings, and side dishes. ? Whether you were active, and what exercises you did. ? Your calorie, weight, and activity goals.  Check your weight regularly. Track any changes. If you notice you have gained weight, make changes to your diet or activity routine.  Avoid taking weight-loss medicines or supplements. Talk to your health care provider before starting any new medicine or supplement.  Talk to your health care provider before trying any new diet or exercise plan. Why are these changes  important? Eating healthy, staying active, and having healthy habits can help you to prevent obesity. Those changes also:  Help you manage stress and emotions.  Help you connect with friends and family.  Improve your self-esteem.  Improve your sleep.  Prevent long-term health problems. What can happen if changes are not made? Being obese or overweight can cause you to develop joint or bone problems, which can make it hard for you to stay active or  do activities you enjoy. Being obese or overweight also puts stress on your heart and lungs and can lead to health problems like diabetes, heart disease, and some cancers. Where to find more information Talk with your health care provider or a dietitian about healthy eating and healthy lifestyle choices. You may also find information from:  [place] Department of Agriculture, MyPlate: FormerBoss.no  American Heart Association: www.heart.org  Centers for Disease Control and Prevention: http://www.wolf.info/ Summary  Staying at a healthy weight is important to your overall health. It helps you to prevent certain diseases and health problems, such as heart disease, diabetes, joint problems, sleep disorders, and some types of cancer.  Being obese or overweight can cause you to develop joint or bone problems, which can make it hard for you to stay active or do activities you enjoy.  You can prevent unhealthy weight gain by eating a healthy diet, exercising regularly, not smoking, limiting alcohol, and getting enough sleep.  Talk with your health care provider or a dietitian for guidance about healthy eating and healthy lifestyle choices. This information is not intended to replace advice given to you by your health care provider. Make sure you discuss any questions you have with your health care provider. Document Revised: 09/23/2019 Document Reviewed: 09/23/2019 Elsevier Patient Education  2021 [place].  Thyroid Nodule  A thyroid nodule is  an isolated growth of thyroid cells that forms a lump in your thyroid gland. The thyroid gland is a butterfly-shaped gland. It is found in the lower front of your neck. This gland sends chemical messengers (hormones) through your blood to all parts of your body. These hormones are important in regulating your body temperature and helping your body to use energy. Thyroid nodules are common. Most are not cancerous (benign). You may have one nodule or several nodules. Different types of thyroid nodules include nodules that:  Grow and fill with fluid (thyroid cysts).  Produce too much thyroid hormone (hot nodules or hyperthyroid).  Produce no thyroid hormone (cold nodules or hypothyroid).  Form from cancer cells (thyroid cancers). What are the causes? In most cases, the cause of this condition is not known. What increases the risk? The following factors may make you more likely to develop this condition.  Age. Thyroid nodules become more common in people who are older than 41 years of age.  Gender. ? Benign thyroid nodules are more common in women. ? Cancerous (malignant) thyroid nodules are more common in men.  A family history that includes: ? Thyroid nodules. ? Pheochromocytoma. ? Thyroid carcinoma. ? Hyperparathyroidism.  Certain kinds of thyroid diseases, such as Hashimoto's thyroiditis.  Lack of iodine in your diet.  A history of head and neck radiation, such as from previous cancer treatment. What are the signs or symptoms? In many cases, there are no symptoms. If you have symptoms, they may include:  A lump in your lower neck.  Feeling a lump or tickle in your throat.  Pain in your neck, jaw, or ear.  Having trouble swallowing. Hot nodules may cause symptoms that include:  Weight loss.  Warm, flushed skin.  Feeling hot.  Feeling nervous.  A racing heartbeat. Cold nodules may cause symptoms that include:  Weight gain.  Dry skin.  Brittle hair. This may  also occur with hair loss.  Feeling cold.  Fatigue. Thyroid cancer nodules may cause symptoms that include:  Hard nodules that feel stuck to the thyroid gland.  Hoarseness.  Lumps in the glands near your thyroid (  lymph nodes). How is this diagnosed? A thyroid nodule may be felt by your health care provider during a physical exam. This condition may also be diagnosed based on your symptoms. You may also have tests, including:  An ultrasound. This may be done to confirm the diagnosis.  A biopsy. This involves taking a sample from the nodule and looking at it under a microscope.  Blood tests to make sure that your thyroid is working properly.  A thyroid scan. This test uses a radioactive tracer injected into a vein to create an image of the thyroid gland on a computer screen.  Imaging tests such as MRI or CT scan. These may be done if: ? Your nodule is large. ? Your nodule is blocking your airway. ? Cancer is suspected. How is this treated? Treatment depends on the cause and size of your nodule or nodules. If the nodule is benign, treatment may not be necessary. Your health care provider may monitor the nodule to see if it goes away without treatment. If the nodule continues to grow, is cancerous, or does not go away, treatment may be needed. Treatment may include:  Having a cystic nodule drained with a needle.  Ablation therapy. In this treatment, alcohol is injected into the area of the nodule to destroy the cells. Ablation with heat (thermal ablation) may also be used.  Radioactive iodine. In this treatment, radioactive iodine is given as a pill or liquid that you drink. This substance causes the thyroid nodule to shrink.  Surgery to remove the nodule. Part or all of your thyroid gland may need to be removed as well.  Medicines. Follow these instructions at home:  Pay attention to any changes in your nodule.  Take over-the-counter and prescription medicines only as told by  your health care provider.  Keep all follow-up visits as told by your health care provider. This is important. Contact a health care provider if:  Your voice changes.  You have trouble swallowing.  You have pain in your neck, ear, or jaw that is getting worse.  Your nodule gets bigger.  Your nodule starts to make it harder for you to breathe.  Your muscles look like they are shrinking (muscle wasting). Get help right away if:  You have chest pain.  There is a loss of consciousness.  You have a sudden fever.  You feel confused.  You are seeing or hearing things that other people do not see or hear (having hallucinations).  You feel very weak.  You have mood swings.  You feel very restless.  You feel suddenly nauseous or throw up.  You suddenly have diarrhea. Summary  A thyroid nodule is an isolated growth of thyroid cells that forms a lump in your thyroid gland.  Thyroid nodules are common. Most are not cancerous (benign). You may have one nodule or several nodules.  Treatment depends on the cause and size of your nodule or nodules. If the nodule is benign, treatment may not be necessary.  Your health care provider may monitor the nodule to see if it goes away without treatment. If the nodule continues to grow, is cancerous, or does not go away, treatment may be needed. This information is not intended to replace advice given to you by your health care provider. Make sure you discuss any questions you have with your health care provider. Document Revised: 01/09/2018 Document Reviewed: 01/12/2018 Elsevier Patient Education  2021 [place] is a spinal disorder. It involves  an excessive outward curve of the spine that causes abnormal rounding of the upper back. It occurs when the spinal bones (vertebrae) in the upper back (thoracic spine) become wedge-shaped and cause deformity. Kyphosis is sometimes called dowager's hump, hunchback, or  roundback. It is most common among elderly people, but it can occur at any age. What are the causes? There are four main types of kyphosis, which have different causes. They include:  Postural kyphosis. This type is caused by poor posture or slouching. It does not involve severe abnormalities in the bone structure of the spine. This is the most common type of kyphosis, and it usually becomes noticeable during adolescence.  Congenital kyphosis. This is caused when the spine fails to develop normally while in the womb. A person is born with this type of kyphosis.  Scheuermann kyphosis. In this type of kyphosis, several of the vertebrae are more triangular in shape for unknown reasons. The curved spine usually becomes noticeable during adolescence.  Osteoporotic kyphosis. This type is caused by thinning and loss of density in the bones (osteoporosis), which results in small breaks (compression fractures) in the thoracic vertebrae. The compression fractures cause these vertebrae to become wedge-shaped over time. Other possible causes include:  Certain syndromes, such as Marfan syndrome or Prader-Willi disease.  Cancer and treatment for cancer. Cancer and its treatment can weaken the vertebrae and cause compression fractures.  Disk degeneration. This refers to the drying out and shrinking of the soft, circular structures between the vertebrae as part of the natural aging process. What are the signs or symptoms? The symptoms of kyphosis vary based on the cause and how severe the curve is (the severity). Symptoms may include:  Rounded shoulders.  A visible hump on the back.  Fatigue.  Back pain (usually mild).  Spine stiffness.  Muscle tightness in the back of the thighs (hamstrings).  Loss of height. Severe symptoms of this condition include:  Loss of feeling in affected areas of the back.  Shortness of breath.  Weakness, numbness, or tingling in the legs. How is this  diagnosed? This condition may be diagnosed based on:  A physical exam. Your health care provider may press on your spine to check for areas of tenderness. Severe cases of curvature will be noticeable by the rounding of the upper back. Milder cases may be harder to diagnose.  Your medical history. Your health care provider may ask about your general health and your symptoms.  X-rays.  MRI.  Tests of the nerves and nervous system (neurological tests). How is this treated? Treatment for this condition depends on your age and overall health, and the type and severity of your kyphosis. Non-surgical treatment may include:  Observation. Your health care provider may monitor your condition over time to make sure that it does not cause problems or get worse.  Physical therapy. This involves movements and exercises to strengthen the back.  NSAIDs to help relieve back pain, such as aspirin, ibuprofen, and naproxen.  Wearing a back brace to correct the curve. Surgery (spinal fusion or kyphoplasty) may be recommended if:  You have congenital kyphosis.  You have Scheuermann kyphosis with a curve greater than 75 degrees.  You have severe back pain that does not improve with non-surgical treatment. Follow these instructions at home:  Take over-the-counter and prescription medicines only as told by your health care provider.  Exercise regularly. Ask your health care provider what activities and exercises are best for you. Keeping your back strong and  flexible may help to relieve symptoms and prevent your condition from getting worse.  If physical therapy was prescribed, do exercises as instructed.  Maintain good posture.  If you were prescribed a back brace: ? Wear the brace as told by your health care provider. ? Remove the brace for bathing. ? Avoid heavy lifting. Do not lift anything that is heavier than the limit that you are told until your health care provider says that it is  safe.  Keep all follow-up visits as told by your health care provider. This is important, especially if your health care provider is monitoring your condition. Contact a health care provider if:  You notice a round hump forming on your back.  You have pain that does not get better with medicine. Summary  Kyphosis is a spinal disorder. It involves an excessive outward curve of the spine that causes abnormal rounding of the upper back.  Symptoms of kyphosis vary based on the cause and severe the curve is.  There are non-surgical and surgical treatments for kyphosis.  Exercise regularly. Ask your health care provider what activities and exercises are best for you. Keeping your back strong and flexible may help to relieve symptoms and prevent your condition from getting worse. This information is not intended to replace advice given to you by your health care provider. Make sure you discuss any questions you have with your health care provider. Document Revised: 05/09/2017 Document Reviewed: 08/28/2016 Elsevier Patient Education  2021 [employer].

## 2020-10-16 ENCOUNTER — Other Ambulatory Visit: Payer: Self-pay | Admitting: Family Medicine

## 2020-10-16 DIAGNOSIS — E559 Vitamin D deficiency, unspecified: Secondary | ICD-10-CM

## 2020-10-16 LAB — HEPATITIS C ANTIBODY
Hepatitis C Ab: NONREACTIVE
SIGNAL TO CUT-OFF: 0.01 (ref <1.00)

## 2020-10-16 MED ORDER — VITAMIN D (ERGOCALCIFEROL) 1.25 MG (50000 UNIT) PO CAPS
50000.0000 [IU] | ORAL_CAPSULE | ORAL | 0 refills | Status: DC
Start: 2020-10-16 — End: 2022-01-11

## 2020-10-18 NOTE — Progress Notes (Signed)
Patient reviewed results on MyChart. 

## 2020-10-19 ENCOUNTER — Ambulatory Visit
Admission: RE | Admit: 2020-10-19 | Discharge: 2020-10-19 | Disposition: A | Discharge status: Home / Self Care | Payer: Federal, State, Local not specified - PPO | Source: Ambulatory Visit | Attending: Family Medicine | Admitting: Family Medicine

## 2020-10-19 DIAGNOSIS — E041 Nontoxic single thyroid nodule: Secondary | ICD-10-CM | POA: Diagnosis not present

## 2020-11-14 DIAGNOSIS — D2239 Melanocytic nevi of other parts of face: Secondary | ICD-10-CM | POA: Diagnosis not present

## 2020-11-14 DIAGNOSIS — L821 Other seborrheic keratosis: Secondary | ICD-10-CM | POA: Diagnosis not present

## 2020-11-14 DIAGNOSIS — L84 Corns and callosities: Secondary | ICD-10-CM | POA: Diagnosis not present

## 2020-11-14 DIAGNOSIS — D2262 Melanocytic nevi of left upper limb, including shoulder: Secondary | ICD-10-CM | POA: Diagnosis not present

## 2020-11-21 DIAGNOSIS — D259 Leiomyoma of uterus, unspecified: Secondary | ICD-10-CM | POA: Diagnosis not present

## 2020-11-21 DIAGNOSIS — Z01419 Encounter for gynecological examination (general) (routine) without abnormal findings: Secondary | ICD-10-CM | POA: Diagnosis not present

## 2020-11-21 DIAGNOSIS — Z1231 Encounter for screening mammogram for malignant neoplasm of breast: Secondary | ICD-10-CM | POA: Diagnosis not present

## 2020-11-21 DIAGNOSIS — N920 Excessive and frequent menstruation with regular cycle: Secondary | ICD-10-CM | POA: Diagnosis not present

## 2020-11-24 ENCOUNTER — Encounter: Payer: Self-pay | Admitting: Family Medicine

## 2020-11-30 DIAGNOSIS — N920 Excessive and frequent menstruation with regular cycle: Secondary | ICD-10-CM | POA: Diagnosis not present

## 2020-11-30 DIAGNOSIS — D259 Leiomyoma of uterus, unspecified: Secondary | ICD-10-CM | POA: Diagnosis not present

## 2020-12-07 DIAGNOSIS — R928 Other abnormal and inconclusive findings on diagnostic imaging of breast: Secondary | ICD-10-CM | POA: Diagnosis not present

## 2020-12-07 DIAGNOSIS — R922 Inconclusive mammogram: Secondary | ICD-10-CM | POA: Diagnosis not present

## 2020-12-08 ENCOUNTER — Encounter: Payer: Self-pay | Admitting: Family Medicine

## 2020-12-08 LAB — HM MAMMOGRAPHY

## 2020-12-15 ENCOUNTER — Ambulatory Visit: Payer: Federal, State, Local not specified - PPO | Admitting: Family Medicine

## 2020-12-15 ENCOUNTER — Encounter: Payer: Self-pay | Admitting: Family Medicine

## 2020-12-15 ENCOUNTER — Other Ambulatory Visit: Payer: Self-pay

## 2020-12-15 VITALS — BP 122/78 | HR 99 | Temp 98.5°F | Wt 246.4 lb

## 2020-12-15 DIAGNOSIS — S76911A Strain of unspecified muscles, fascia and tendons at thigh level, right thigh, initial encounter: Secondary | ICD-10-CM

## 2020-12-15 DIAGNOSIS — M79651 Pain in right thigh: Secondary | ICD-10-CM

## 2020-12-15 NOTE — Patient Instructions (Signed)
You can strain the muscles in your thighs which can cause pain off and on that improves with time.  This muscle strain can also cause inflammation which can press on nerves that are in your thighs such as her saphenous nerve.  Typically with the saphenous nerve compression you may have pain deep in your thigh as well is on the inside of your thigh.  Other nerve entrapment or compression can cause pain on the lateral or outside portion of your thigh.  Treatment for these injuries would be the same and include rest, stretching, ice during initial injury, heat, massage, NSAIDs (ibuprofen, Aleve, Advil) or Tylenol.  I have included some information about muscle strain and nerve entrapment as well as exercises for you to review.  If you are continuing to have pain or it becomes worse in the next few weeks please let us know.

## 2020-12-15 NOTE — Progress Notes (Signed)
Subjective:    Patient ID: Melanie [name], female    DOB: 1980/12/11, 40 y.o.   MRN: [id number]  Chief Complaint  Patient presents with   Pain    Right thigh pain for 3 1/2 weeks, pain comes and goes. Works with Physiological scientist at [employer], sometimes when pain comes she can move/ exercise and it will go away. Pain is not excruciating, but is uncomfortable, hurts when sitting, laying. Aleve helps with pain.    HPI Patient was seen today for ongoing concern.  Patient with intermittent right thigh pain x 3.5 weeks.  Sensation noted deep in right thigh and laterally.  Was noticing the pain with laying down or with driving.  Went swimming today without any issue.  Denies muscle cramps, numbness, tingling, weakness in RLE.  Denies back pain, pain in posterior thigh or buttock.  Patient exercising with a trainer regularly.  Recently took a break from doing legs given ongoing pain.  Tried OFV for symptoms.  Past Medical History:  Diagnosis Date   Fibroid    Hx of varicella    Vaginal Pap smear, abnormal     Allergies  Allergen Reactions   Latex Itching    Gets UTI's from latex condoms   Nickel     Skin irritation    ROS General: Denies fever, chills, night sweats, changes in weight, changes in appetite HEENT: Denies headaches, ear pain, changes in vision, rhinorrhea, sore throat CV: Denies CP, palpitations, SOB, orthopnea Pulm: Denies SOB, cough, wheezing GI: Denies abdominal pain, nausea, vomiting, diarrhea, constipation GU: Denies dysuria, hematuria, frequency, vaginal discharge Msk: Denies muscle cramps, joint pains  + right deep medial and lateral thigh pain Neuro: Denies weakness, numbness, tingling Skin: Denies rashes, bruising Psych: Denies depression, anxiety, hallucinations    Objective:    Blood pressure 122/78, pulse 99, temperature 98.5 F (36.9 C), temperature source Oral, weight 246 lb 6.4 oz (111.8 kg), SpO2 99 %, unknown if currently breastfeeding.  Gen. Pleasant,  well-nourished, in no distress, normal affect   HEENT: [place]/AT, face symmetric, conjunctiva clear, no scleral icterus, PERRLA, EOMI Lungs: no accessory muscle use Cardiovascular: RRR, no peripheral edema Musculoskeletal: No TTP of cervical, thoracic, lumbar, or paraspinal muscles.  No TTP of sciatic nerve.  Strength 5/5 in bilateral LEs.  No deformities, no cyanosis or clubbing, normal tone. Neuro:  A&Ox3, CN II-XII intact, normal gait Skin:  Warm, no lesions/ rash   Wt Readings from Last 3 Encounters:  12/15/20 246 lb 6.4 oz (111.8 kg)  10/13/20 249 lb (112.9 kg)  04/07/20 242 lb 12.8 oz (110.1 kg)    Lab Results  Component Value Date   WBC 8.0 10/13/2020   HGB 11.6 (L) 10/13/2020   HCT 36.3 10/13/2020   PLT 331.0 10/13/2020   GLUCOSE 91 10/13/2020   CHOL 191 10/13/2020   TRIG 74.0 10/13/2020   HDL 58.80 10/13/2020   LDLCALC 117 (H) 10/13/2020   ALT 15 10/13/2020   AST 13 10/13/2020   NA 137 10/13/2020   K 4.6 10/13/2020   CL 103 10/13/2020   CREATININE 0.71 10/13/2020   BUN 10 10/13/2020   CO2 27 10/13/2020   TSH 1.94 10/13/2020   HGBA1C 5.9 10/13/2020    Assessment/Plan:  Right thigh pain  Muscle strain of right thigh, initial encounter  -Discussed possible causes including muscle strain causing inflammation and nerve compression.  Discussed supportive care including stretching, ice, heat, NSAIDs or Tylenol, rest.  Given handouts and various exercises to try.  Discussed wearing supportive shoes while exercising.  Given precautions.  F/u as needed in the next few weeks for continued symptoms  Grier Mitts, MD

## 2020-12-20 DIAGNOSIS — R87612 Low grade squamous intraepithelial lesion on cytologic smear of cervix (LGSIL): Secondary | ICD-10-CM | POA: Diagnosis not present

## 2020-12-20 DIAGNOSIS — R87810 Cervical high risk human papillomavirus (HPV) DNA test positive: Secondary | ICD-10-CM | POA: Diagnosis not present

## 2021-01-30 DIAGNOSIS — Z20822 Contact with and (suspected) exposure to covid-19: Secondary | ICD-10-CM | POA: Diagnosis not present

## 2021-02-21 DIAGNOSIS — Z713 Dietary counseling and surveillance: Secondary | ICD-10-CM | POA: Diagnosis not present

## 2021-04-08 DIAGNOSIS — Z20822 Contact with and (suspected) exposure to covid-19: Secondary | ICD-10-CM | POA: Diagnosis not present

## 2021-04-08 DIAGNOSIS — U071 COVID-19: Secondary | ICD-10-CM

## 2021-04-08 HISTORY — DX: COVID-19: U07.1

## 2021-06-10 DIAGNOSIS — D48119 Desmoid tumor of unspecified site: Secondary | ICD-10-CM

## 2021-06-10 HISTORY — DX: Desmoid tumor of unspecified site: D48.119

## 2021-06-24 IMAGING — US US THYROID
1 series · 14 of 25 positions shown · non-contrast
Comparison: None.

CLINICAL DATA: Left thyroid nodule on physical exam

EXAM:
THYROID ULTRASOUND
TECHNIQUE: Ultrasound examination of the thyroid gland and adjacent soft
tissues was performed.

[Series 1: us thyroid · 0.06mm/px · 14 of 31 slices shown]
[im 1/31]
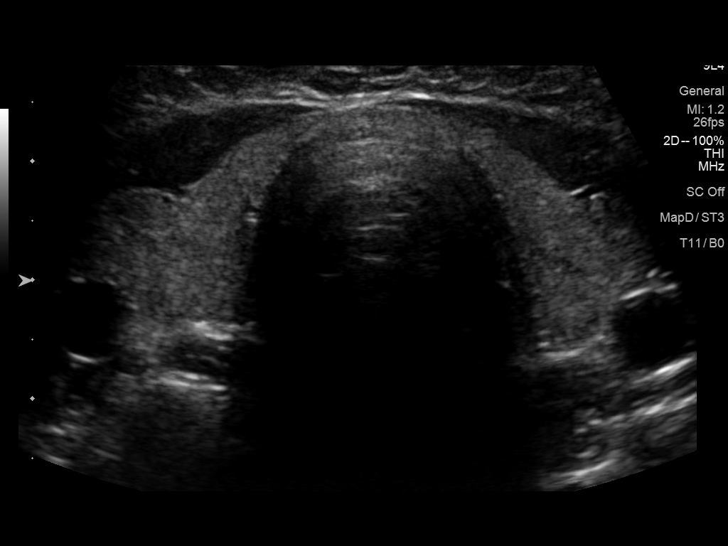
[im 3/31]
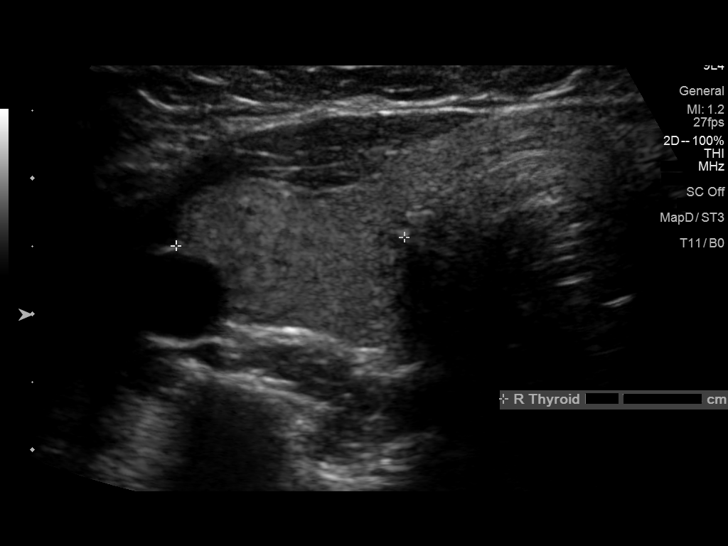
[im 6/31]
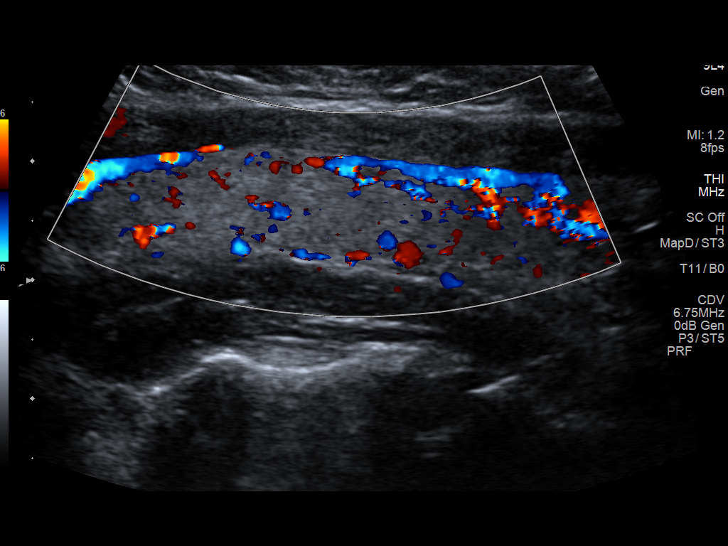
[im 8/31]
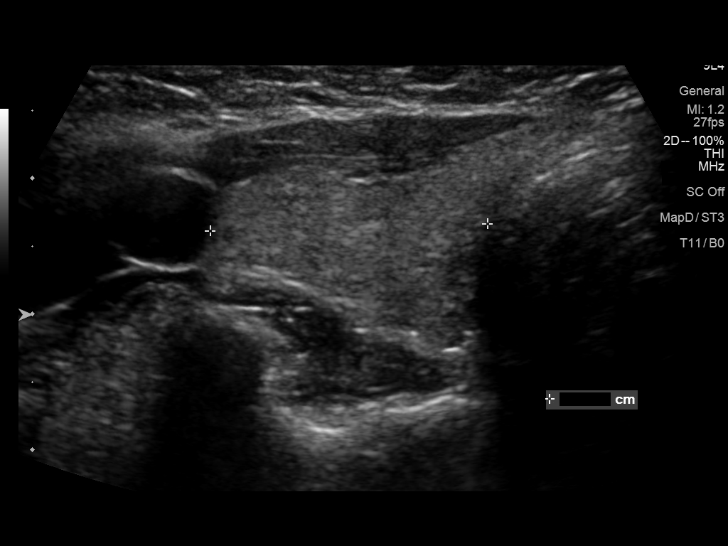
[im 11/31]
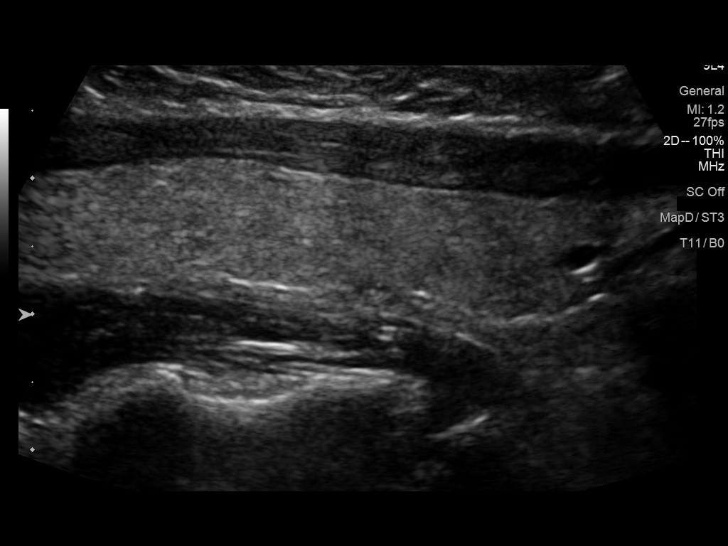
[im 12/31]
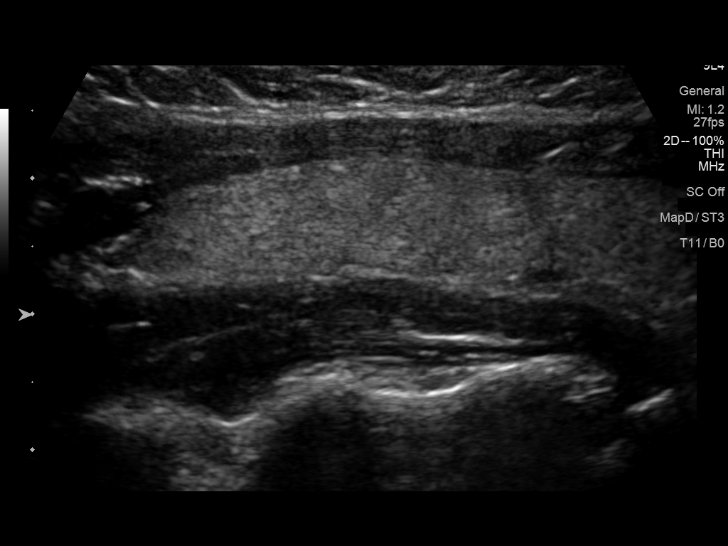
[im 14/31]
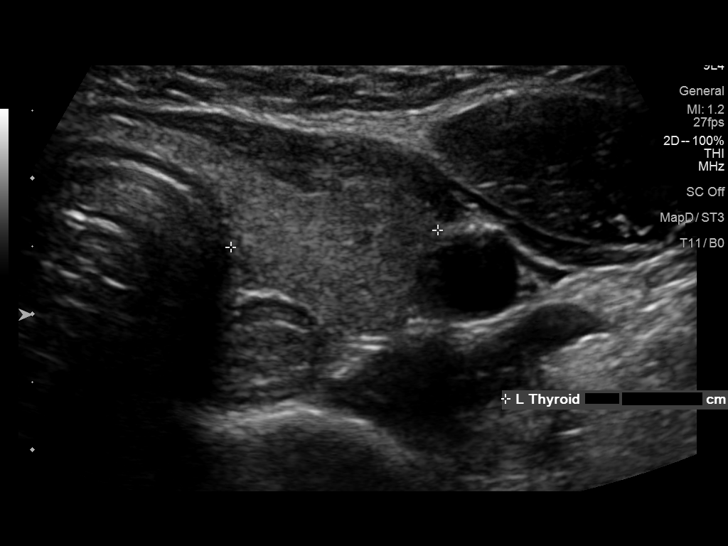
[im 17/31]
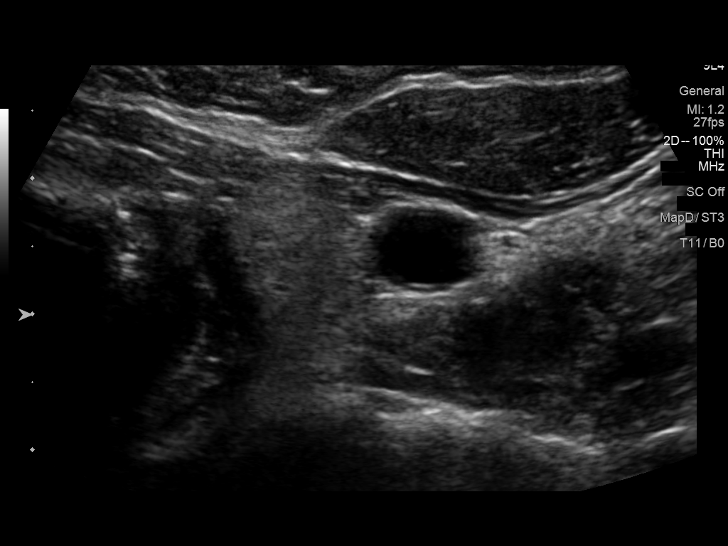
[im 19/31]
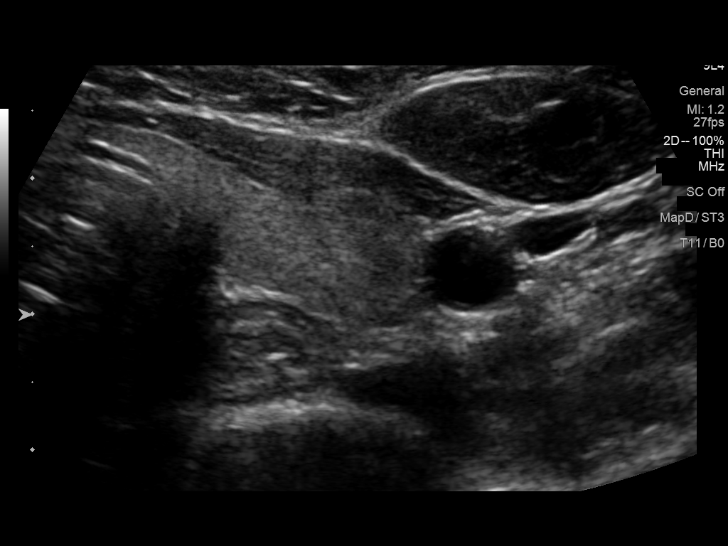
[im 21/31]
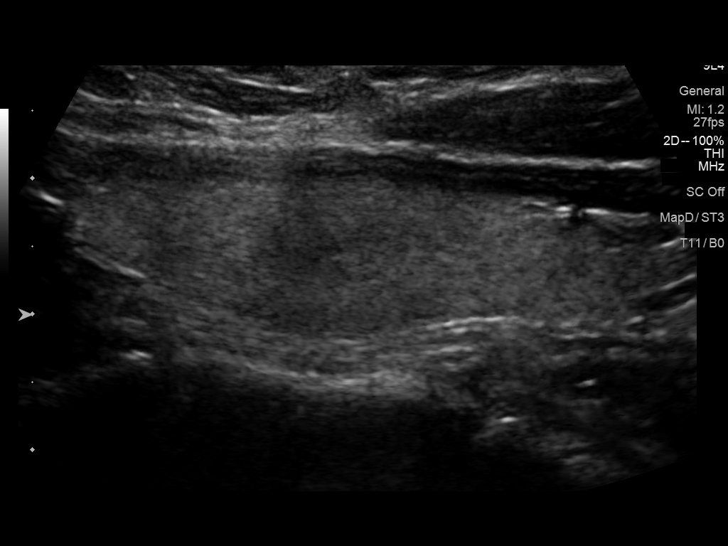
[im 23/31]
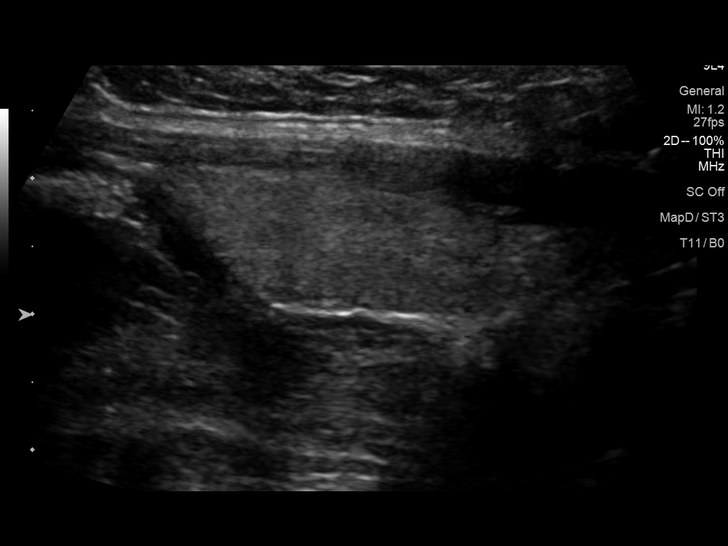
[im 26/31]
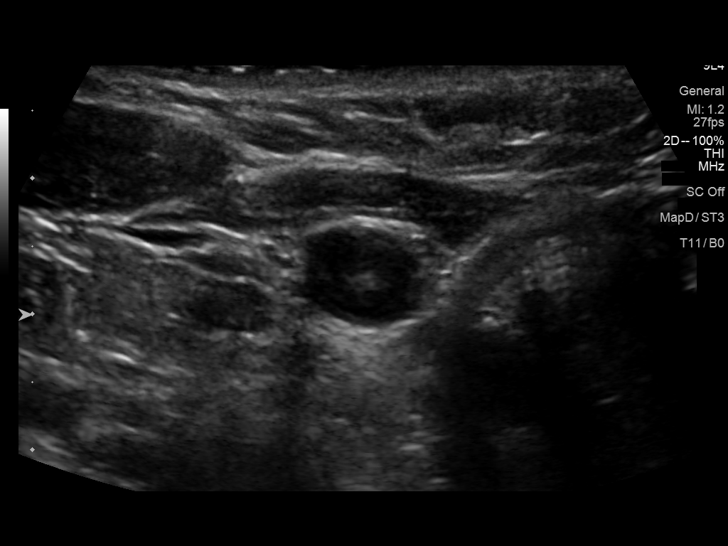
[im 28/31]
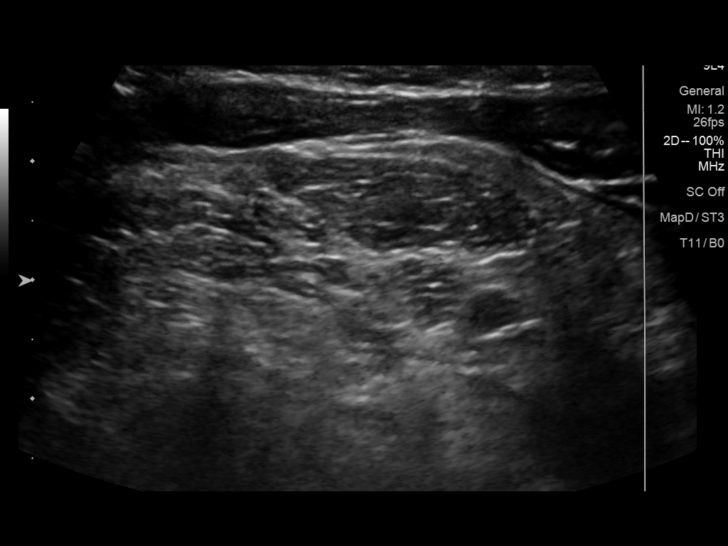
[im 31/31]
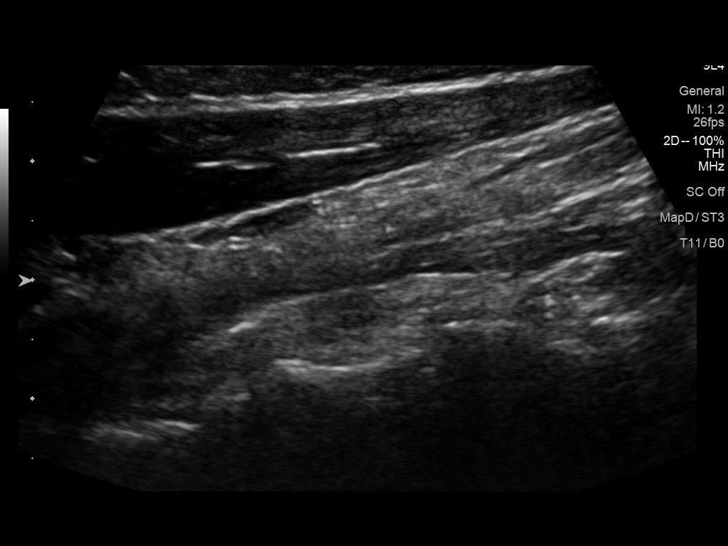

[14 of 25 positions shown; findings below may reference images not displayed]

FINDINGS: Parenchymal Echotexture: Mildly heterogenous

Isthmus: 0.2 cm

Right lobe: 4.9 x 0.9 x 1.7 cm

Left lobe: 5.0 x 1.3 x 1.5 cm

_________________________________________________________

Estimated total number of nodules >/= 1 cm: 0

Number of spongiform nodules >/=  2 cm not described below (TR1): 0

Number of mixed cystic and solid nodules >/= 1.5 cm not described
below (TR2): 0

_________________________________________________________

No discrete nodules are seen within the thyroid gland.
IMPRESSION: Mild diffuse heterogeneity of the thyroid parenchyma without
discrete nodule.

The above is in keeping with the ACR TI-RADS recommendations - [HOSPITAL] 0073;[DATE].

## 2021-07-13 ENCOUNTER — Ambulatory Visit: Payer: Federal, State, Local not specified - PPO | Admitting: Family Medicine

## 2021-10-01 ENCOUNTER — Ambulatory Visit: Payer: Federal, State, Local not specified - PPO | Admitting: Family Medicine

## 2021-10-01 ENCOUNTER — Encounter: Payer: Self-pay | Admitting: Family Medicine

## 2021-10-01 VITALS — BP 120/70 | HR 103 | Resp 16 | Ht 72.0 in | Wt 250.5 lb

## 2021-10-01 DIAGNOSIS — R21 Rash and other nonspecific skin eruption: Secondary | ICD-10-CM | POA: Diagnosis not present

## 2021-10-01 MED ORDER — CLOTRIMAZOLE-BETAMETHASONE 1-0.05 % EX CREA: 1.0000 "application " | TOPICAL_CREAM | Freq: Every day | CUTANEOUS | 1 refills | Status: DC

## 2021-10-01 NOTE — Progress Notes (Signed)
? ? ?ACUTE VISIT ?Chief Complaint  ?Patient presents with  ? Rash  ?  X months, has been flaring up badly this week.  ? ?HPI: ?Ms.Melanie [name] is a 41 y.o. female with history of seasonal allergies here today complaining of at least 6 months of pruritic rash on different areas of her body as described above. ?She has not identified exacerbating or alleviating factors. ? ?Rash ?This is a new problem. The current episode started more than 1 month ago. The problem has been gradually worsening since onset. The affected locations include the left axilla, neck, right axilla and abdomen. The rash is characterized by dryness and itchiness. She was exposed to nothing. Pertinent negatives include no anorexia, congestion, cough, diarrhea, eye pain, facial edema, fatigue, fever, joint pain, nail changes, rhinorrhea, shortness of breath, sore throat or vomiting. Past treatments include topical steroids. Her past medical history is significant for allergies and eczema. There is no history of asthma.  ?Negative for any new medication, detergent, soap, or body product. ?No known insect bite or outdoor exposures to plants. ?No sick contact. ?No Hx of eczema or similar rash in the past. ?She has used prescription steroid cream, which helped some. ? ?Review of Systems  ?Constitutional:  Negative for activity change, fatigue and fever.  ?HENT:  Negative for congestion, rhinorrhea and sore throat.   ?Eyes:  Negative for pain and discharge.  ?Respiratory:  Negative for cough and shortness of breath.   ?Gastrointestinal:  Negative for anorexia, diarrhea and vomiting.  ?Musculoskeletal:  Negative for arthralgias, joint pain and myalgias.  ?Skin:  Positive for rash. Negative for nail changes.  ?Rest see pertinent positives and negatives per HPI. ? ?Current Outpatient Medications on File Prior to Visit  ?Medication Sig Dispense Refill  ? Vitamin D, Ergocalciferol, (DRISDOL) 1.25 MG (50000 UNIT) CAPS capsule Take 1 capsule (50,000  Units total) by mouth every 7 (seven) days. 12 capsule 0  ? ?No current facility-administered medications on file prior to visit.  ? ?Past Medical History:  ?Diagnosis Date  ? Fibroid   ? Hx of varicella   ? Vaginal Pap smear, abnormal   ? ?Allergies  ?Allergen Reactions  ? Latex Itching  ?  Gets UTI's from latex condoms  ? Nickel   ?  Skin irritation  ? ?Social History  ? ?Socioeconomic History  ? Marital status: Married  ?  Spouse name: Not on file  ? Number of children: Not on file  ? Years of education: Not on file  ? Highest education level: Not on file  ?Occupational History  ? Not on file  ?Tobacco Use  ? Smoking status: Never  ? Smokeless tobacco: Never  ?Substance and Sexual Activity  ? Alcohol use: Yes  ?  Comment: socially  ? Drug use: No  ? Sexual activity: Yes  ?  Birth control/protection: None  ?Other Topics Concern  ? Not on file  ?Social History Narrative  ? Not on file  ? ?Social Determinants of Health  ? ?Financial Resource Strain: Not on file  ?Food Insecurity: Not on file  ?Transportation Needs: Not on file  ?Physical Activity: Not on file  ?Stress: Not on file  ?Social Connections: Not on file  ? ?Vitals:  ? 10/01/21 1421  ?BP: 120/70  ?Pulse: (!) 103  ?Resp: 16  ?SpO2: 99%  ? ?Body mass index is 33.97 kg/m?. ? ?Physical Exam ?Vitals and nursing note reviewed.  ?Constitutional:   ?   General: She is not in  acute distress. ?   Appearance: She is well-developed.  ?HENT:  ?   Head: Normocephalic and atraumatic.  ?   Mouth/Throat:  ?   Mouth: Mucous membranes are moist.  ?   Pharynx: Oropharynx is clear.  ?Eyes:  ?   Conjunctiva/sclera: Conjunctivae normal.  ?Cardiovascular:  ?   Rate and Rhythm: Normal rate and regular rhythm.  ?   Heart sounds: No murmur heard. ?   Comments: HR 84/min ?Pulmonary:  ?   Effort: Pulmonary effort is normal. No respiratory distress.  ?   Breath sounds: Normal breath sounds.  ?Lymphadenopathy:  ?   Cervical: No cervical adenopathy.  ?Skin: ?   General: Skin is warm.  ?    Findings: Rash present.  ?   Comments: Hyperpigmented scaly patchy and confluent around neck (mild), axillary areas extending to anterior aspect, on inner breat quadrant bilateral, and flanks.It is not erythematous, tender,or indurated. Denies borders.  ?Neurological:  ?   Mental Status: She is alert and oriented to person, place, and time.  ?Psychiatric:  ?   Comments: Well groomed, good eye contact.  ? ?ASSESSMENT AND PLAN: ? ?Ms.Melanie [name] was seen today for rash. ? ?Diagnoses and all orders for this visit: ? ?Rash and nonspecific skin eruption ?-     clotrimazole-betamethasone (LOTRISONE) cream; Apply 1 application. topically daily. ? ?We discussed possible etiologies: Eczema, intertrigo, and seborrheic dermatitis are in the differential diagnosis. ?All these can also present at the same time. ? ?Areas on breast seem more fungal etiology as well as anterior axillary area. ?Recommend Lotrisone cream, small amount, twice daily for 14 days on affected areas. ?Selsun blue from neck down to leave for 5 to 10 minutes 3 times per week for 30 days. ?Monitor for new symptoms. ?We discussed some side effects of topical steroid. ? ?Return in about 4 weeks (around 10/29/2021) for PCP. ? ?Halynn Reitano G. Martinique, MD ? ?[place]. ?[place] office. ? ?

## 2021-10-01 NOTE — Patient Instructions (Addendum)
A few things to remember from today's visit: ? ?Rash and nonspecific skin eruption - Plan: clotrimazole-betamethasone (LOTRISONE) cream ? ?Eczema, dermatitis seborrheic,and intertrigo are in the differential. ?Use cream twice daily, small amount for 14 days. ?Selsun blue from neck down for 5-10 min 2-3 times per week for 30 days. ? ?If you need refills please call your pharmacy. ?Do not use My Chart to request refills or for acute issues that need immediate attention. ?  ?Please be sure medication list is accurate. ?If a new problem present, please set up appointment sooner than planned today. ? ? ? ? ? ? ? ?

## 2021-11-28 DIAGNOSIS — N920 Excessive and frequent menstruation with regular cycle: Secondary | ICD-10-CM | POA: Diagnosis not present

## 2021-11-28 DIAGNOSIS — Z01419 Encounter for gynecological examination (general) (routine) without abnormal findings: Secondary | ICD-10-CM | POA: Diagnosis not present

## 2021-11-28 DIAGNOSIS — R87810 Cervical high risk human papillomavirus (HPV) DNA test positive: Secondary | ICD-10-CM | POA: Diagnosis not present

## 2021-11-28 DIAGNOSIS — R87612 Low grade squamous intraepithelial lesion on cytologic smear of cervix (LGSIL): Secondary | ICD-10-CM | POA: Diagnosis not present

## 2021-11-28 DIAGNOSIS — Z1231 Encounter for screening mammogram for malignant neoplasm of breast: Secondary | ICD-10-CM | POA: Diagnosis not present

## 2021-12-14 ENCOUNTER — Encounter: Payer: Self-pay | Admitting: Family Medicine

## 2021-12-14 DIAGNOSIS — N6489 Other specified disorders of breast: Secondary | ICD-10-CM | POA: Diagnosis not present

## 2021-12-14 DIAGNOSIS — R928 Other abnormal and inconclusive findings on diagnostic imaging of breast: Secondary | ICD-10-CM | POA: Diagnosis not present

## 2021-12-14 LAB — HM MAMMOGRAPHY

## 2021-12-18 ENCOUNTER — Telehealth: Payer: Self-pay | Admitting: Physician Assistant

## 2021-12-18 NOTE — Telephone Encounter (Signed)
Scheduled appt per 7/6 referral. Pt is aware of appt date and time. Pt is aware to arrive 15 mins prior to appt time and to bring and updated insurance card. Pt is aware of appt location.   

## 2021-12-20 ENCOUNTER — Encounter: Payer: Self-pay | Admitting: Family Medicine

## 2021-12-20 ENCOUNTER — Other Ambulatory Visit: Payer: Self-pay

## 2021-12-20 DIAGNOSIS — N6489 Other specified disorders of breast: Secondary | ICD-10-CM | POA: Diagnosis not present

## 2021-12-20 DIAGNOSIS — N6315 Unspecified lump in the right breast, overlapping quadrants: Secondary | ICD-10-CM | POA: Diagnosis not present

## 2021-12-20 NOTE — Progress Notes (Signed)
[facility] 

## 2022-01-10 NOTE — Progress Notes (Signed)
[place] [phone]   Fax:([phone]) [place] NOTE  Patient Care Team: Billie Ruddy, MD as PCP - General (Family Medicine)  Hematological/Oncological History 1) Labs from OB/GYN: -11/28/2021: WBC 9.0, Hgb 7.3, MCV 62, Plt 495  2) 01/11/2022: Establish care with [facility] Hematology with Dr. Narda Rutherford and Dede Query PA-C  CHIEF COMPLAINTS/PURPOSE OF CONSULTATION:  "Microcytic anemia "  HISTORY OF PRESENTING ILLNESS:  Melanie [name] 41 y.o. female with medical history significant for fibroids presents to the clinic for initial evaluation for microcytic anemia. She is unaccompanied for this visit.   On exam today, Melanie [name] reports having low energy that require frequent resting. She is able to  complete her daily activities but gets easy winded. She has a good appetite but adds that she does not eat red meat or pork. She denies nausea, vomiting or abdominal pain. She reports having some constipation with a bowel movement every 2-3 days. She denies using any stool softeners or laxatives. She denies easy bruising or signs of bleeding. She reports heavy menstrual bleeding over the past year. Her menstrual bleeding is every 28 days lasting 7-10 days with 2 days of heavy bleeding which requires changing tampons/pads every hour. She reports some shortness of breath with exertion but none at rest. She denies fevers, chills, sweats, chest pain, cough, headaches, dizziness or syncopal episodes. She has no other complaints. Rest of the 10 point ROS is below.   MEDICAL HISTORY:  Past Medical History:  Diagnosis Date   Fibroid    Hx of varicella    Vaginal Pap smear, abnormal     SURGICAL HISTORY: Past Surgical History:  Procedure Laterality Date   CESAREAN SECTION N/A 11/23/2015   Procedure: Primary CESAREAN SECTION;  Surgeon: Servando Salina, MD;  Location: [facility];  Service: Obstetrics;  Laterality: N/A;  EDD: 12/13/15    LAPAROTOMY N/A 12/04/2013   Procedure: Exploratory Laparotomy with Evacuation Hematoma ;  Surgeon: Marvene Staff, MD;  Location: [facility];  Service: Gynecology;  Laterality: N/A;   MYOMECTOMY N/A 12/03/2013   Procedure: Exploratory Laparotomy MYOMECTOMY;  Surgeon: Marvene Staff, MD;  Location: [facility];  Service: Gynecology;  Laterality: N/A;    SOCIAL HISTORY: Social History   Socioeconomic History   Marital status: Married    Spouse name: Not on file   Number of children: Not on file   Years of education: Not on file   Highest education level: Not on file  Occupational History   Not on file  Tobacco Use   Smoking status: Never   Smokeless tobacco: Never  Substance and Sexual Activity   Alcohol use: Yes    Comment: socially   Drug use: No   Sexual activity: Yes    Birth control/protection: None  Other Topics Concern   Not on file  Social History Narrative   Not on file   Social Determinants of Health   Financial Resource Strain: Not on file  Food Insecurity: Not on file  Transportation Needs: Not on file  Physical Activity: Not on file  Stress: Not on file  Social Connections: Not on file  Intimate Partner Violence: Not on file    FAMILY HISTORY: Family History  Problem Relation Age of Onset   Cancer Father        prostate   Diabetes Father    Heart disease Father    Multiple myeloma Father    Cancer Paternal Grandmother  colon   Diabetes Paternal Grandmother     ALLERGIES:  is allergic to latex and nickel.  MEDICATIONS:  Current Outpatient Medications  Medication Sig Dispense Refill   Multiple Vitamin (MULTIVITAMIN) tablet Take 1 tablet by mouth daily.     No current facility-administered medications for this visit.    REVIEW OF SYSTEMS:   Constitutional: ( - ) fevers, ( - )  chills , ( - ) night sweats Eyes: ( - ) blurriness of vision, ( - ) double vision, ( - ) watery eyes Ears, nose, mouth, throat, and face: ( - ) mucositis, ( - )  sore throat Respiratory: ( - ) cough, ( + ) dyspnea, ( - ) wheezes Cardiovascular: ( - ) palpitation, ( - ) chest discomfort, ( - ) lower extremity swelling Gastrointestinal:  ( - ) nausea, ( - ) heartburn, ( + ) change in bowel habits Skin: ( - ) abnormal skin rashes Lymphatics: ( - ) new lymphadenopathy, ( - ) easy bruising Neurological: ( - ) numbness, ( - ) tingling, ( - ) new weaknesses Behavioral/Psych: ( - ) mood change, ( - ) new changes  All other systems were reviewed with the patient and are negative.  PHYSICAL EXAMINATION: ECOG PERFORMANCE STATUS: 1 - Symptomatic but completely ambulatory  Vitals:   01/11/22 1406  BP: (!) 156/91  Pulse: (!) 110  Resp: 15  Temp: 98.1 F (36.7 C)  SpO2: 100%   Filed Weights   01/11/22 1406  Weight: 253 lb 9.6 oz (115 kg)    GENERAL: well appearing female in NAD  SKIN: skin color, texture, turgor are normal, no rashes or significant lesions EYES: conjunctiva are pink and non-injected, sclera clear OROPHARYNX: no exudate, no erythema; lips, buccal mucosa, and tongue normal  NECK: supple, non-tender LYMPH:  no palpable lymphadenopathy in the cervical or supraclavicular lymph nodes.  LUNGS: clear to auscultation and percussion with normal breathing effort HEART: regular rate & rhythm and no murmurs and no lower extremity edema ABDOMEN: soft, non-tender, non-distended, normal bowel sounds Musculoskeletal: no cyanosis of digits and no clubbing  PSYCH: alert & oriented x 3, fluent speech NEURO: no focal motor/sensory deficits  LABORATORY DATA:  I have reviewed the data as listed    Latest Ref Rng & Units 01/11/2022    2:49 PM 10/13/2020   10:22 AM 11/24/2015    5:57 AM  CBC  WBC 4.0 - 10.5 K/uL 9.0  8.0  13.7   Hemoglobin 12.0 - 15.0 g/dL 7.3  11.6  10.4   Hematocrit 36.0 - 46.0 % 27.1  36.3  31.5   Platelets 150 - 400 K/uL 526  331.0  201        Latest Ref Rng & Units 01/11/2022    2:49 PM 10/13/2020   10:22 AM  CMP  Glucose 70 -  99 mg/dL 80  91   BUN 6 - 20 mg/dL 9  10   Creatinine 0.44 - 1.00 mg/dL 0.85  0.71   Sodium 135 - 145 mmol/L 137  137   Potassium 3.5 - 5.1 mmol/L 3.9  4.6   Chloride 98 - 111 mmol/L 103  103   CO2 22 - 32 mmol/L 28  27   Calcium 8.9 - 10.3 mg/dL 9.1  9.5   Total Protein 6.5 - 8.1 g/dL 7.6  7.0   Total Bilirubin 0.3 - 1.2 mg/dL 0.3  0.5   Alkaline Phos 38 - 126 U/L 75  66   AST  15 - 41 U/L 26  13   ALT 0 - 44 U/L 39  15    RADIOGRAPHIC STUDIES: I have personally reviewed the radiological images as listed and agreed with the findings in the report. No results found.  ASSESSMENT & PLAN Melanie [name] is a 41 y.o. female who presents to the hematology clinic for initial evaluation for microcytic anemia. I reviewed underlying causes and suspect anemia is from iron deficiency secondary to heavy menstrual bleeding. Patient will proceed with repeat labs today to check CBC, CMP, retic panel, ferritin, iron and TIBC. Due to ongoing constipation and low Hgb levels, recommend IV iron to bolster levels.   #Microcytic anemia: --Most likely iron deficiency 2/2 menstrual bleeding --Labs  today to check CBC, CMP, retic panel, ferritin, iron and TIBC --If Hgb is <8, recommend 1 unit of PRBC --Recommend IV iron infusions to help bolster iron levels --If constipation improves, okay to take ferrous sulfate 325 mg once a day with a source of vitamin C --Provided list of iron rich foods to incorporate into diet --RTC in 4 weeks for lab only appt and 8 weeks for follow up visit with labs.   #Constipation: --Recommend to take OTC Senna-S 2 tablets a day.   #Heavy menstrual bleeding: --History of fibroids s/p myomectomy in 2015. --Recommend to discuss additional interventions with OB/GYN. Patient is interested in hysterectomy.     Orders Placed This Encounter  Procedures   CBC with Differential ([facility] Only)    Standing Status:   Future    Number of Occurrences:   1    Standing  Expiration Date:   01/11/2023   CMP ([facility] only)    Standing Status:   Future    Number of Occurrences:   1    Standing Expiration Date:   01/11/2023   Ferritin    Standing Status:   Future    Number of Occurrences:   1    Standing Expiration Date:   01/11/2023   Iron and Iron Binding Capacity ([facility] only)    Standing Status:   Future    Number of Occurrences:   1    Standing Expiration Date:   01/11/2023   Retic Panel    Standing Status:   Future    Number of Occurrences:   1    Standing Expiration Date:   01/11/2023   Sample to Blood Bank    Standing Status:   Future    Number of Occurrences:   1    Standing Expiration Date:   01/12/2023    All questions were answered. The patient knows to call the clinic with any problems, questions or concerns.  I have spent a total of 60 minutes minutes of face-to-face and non-face-to-face time, preparing to see the patient, obtaining and/or reviewing separately obtained history, performing a medically appropriate examination, counseling and educating the patient, ordering tests/procedures, documenting clinical information in the electronic health record,   and care coordination.   Dede Query, PA-C Department of Hematology/Oncology [place] at [facility] Phone: [phone]  Patient was seen with Dr. Lorenso Courier  I have read the above note and personally examined the patient. I agree with the assessment and plan as noted above.  Briefly Melanie [name] is a 41 year old female who presents for evaluation of microcytic anemia.  Last evaluation on 11/28/2021 the patient was found to have a hemoglobin of 7.3.  At this time findings appear most consistent with iron deficiency anemia  secondary to heavy GYN bleeding.  We will conduct a full iron workup to include iron panel, ferritin and reticulocyte panel.  The patient is found to have continued iron deficiency anemia would recommend pursuing IV iron treatment.  Additionally  we recommend she work with OB/GYN to get her cycles under better control.  The patient voiced understanding of the plan moving forward.   Ledell Peoples, MD Department of Hematology/Oncology [place] at [facility] Phone: [phone] Pager: [phone] Email: [email or web address]

## 2022-01-11 ENCOUNTER — Encounter: Payer: Self-pay | Admitting: Physician Assistant

## 2022-01-11 ENCOUNTER — Other Ambulatory Visit: Payer: Self-pay

## 2022-01-11 ENCOUNTER — Inpatient Hospital Stay: Payer: Federal, State, Local not specified - PPO

## 2022-01-11 ENCOUNTER — Inpatient Hospital Stay: Payer: Federal, State, Local not specified - PPO | Attending: Physician Assistant | Admitting: Physician Assistant

## 2022-01-11 VITALS — BP 156/91 | HR 110 | Temp 98.1°F | Resp 15 | Wt 253.6 lb

## 2022-01-11 DIAGNOSIS — Z79899 Other long term (current) drug therapy: Secondary | ICD-10-CM | POA: Diagnosis not present

## 2022-01-11 DIAGNOSIS — Z833 Family history of diabetes mellitus: Secondary | ICD-10-CM | POA: Diagnosis not present

## 2022-01-11 DIAGNOSIS — R194 Change in bowel habit: Secondary | ICD-10-CM | POA: Diagnosis not present

## 2022-01-11 DIAGNOSIS — Z808 Family history of malignant neoplasm of other organs or systems: Secondary | ICD-10-CM | POA: Insufficient documentation

## 2022-01-11 DIAGNOSIS — K5900 Constipation, unspecified: Secondary | ICD-10-CM | POA: Diagnosis not present

## 2022-01-11 DIAGNOSIS — R0600 Dyspnea, unspecified: Secondary | ICD-10-CM | POA: Insufficient documentation

## 2022-01-11 DIAGNOSIS — D509 Iron deficiency anemia, unspecified: Secondary | ICD-10-CM | POA: Insufficient documentation

## 2022-01-11 DIAGNOSIS — Z8042 Family history of malignant neoplasm of prostate: Secondary | ICD-10-CM | POA: Insufficient documentation

## 2022-01-11 DIAGNOSIS — D649 Anemia, unspecified: Secondary | ICD-10-CM | POA: Insufficient documentation

## 2022-01-11 DIAGNOSIS — Z8249 Family history of ischemic heart disease and other diseases of the circulatory system: Secondary | ICD-10-CM | POA: Diagnosis not present

## 2022-01-11 DIAGNOSIS — Z800 Family history of malignant neoplasm of digestive organs: Secondary | ICD-10-CM | POA: Diagnosis not present

## 2022-01-11 DIAGNOSIS — N920 Excessive and frequent menstruation with regular cycle: Secondary | ICD-10-CM | POA: Insufficient documentation

## 2022-01-11 LAB — CMP (CANCER CENTER ONLY)
ALT: 39 U/L (ref 0–44)
AST: 26 U/L (ref 15–41)
Albumin: 4.4 g/dL (ref 3.5–5.0)
Alkaline Phosphatase: 75 U/L (ref 38–126)
Anion gap: 6 (ref 5–15)
BUN: 9 mg/dL (ref 6–20)
CO2: 28 mmol/L (ref 22–32)
Calcium: 9.1 mg/dL (ref 8.9–10.3)
Chloride: 103 mmol/L (ref 98–111)
Creatinine: 0.85 mg/dL (ref 0.44–1.00)
GFR, Estimated: >60 mL/min (ref >60)
Glucose, Bld: 80 mg/dL (ref 70–99)
Potassium: 3.9 mmol/L (ref 3.5–5.1)
Sodium: 137 mmol/L (ref 135–145)
Total Bilirubin: 0.3 mg/dL (ref 0.3–1.2)
Total Protein: 7.6 g/dL (ref 6.5–8.1)

## 2022-01-11 LAB — RETIC PANEL
Immature Retic Fract: 35.4 % — ABNORMAL HIGH (ref 2.3–15.9)
RBC.: 4.18 MIL/uL (ref 3.87–5.11)
Retic Count, Absolute: 122.9 10*3/uL (ref 19.0–186.0)
Retic Ct Pct: 2.9 % (ref 0.4–3.1)
Reticulocyte Hemoglobin: 18 pg — ABNORMAL LOW (ref >27.9)

## 2022-01-11 LAB — FERRITIN: Ferritin: 5 ng/mL — ABNORMAL LOW (ref 11–307)

## 2022-01-11 LAB — CBC WITH DIFFERENTIAL (CANCER CENTER ONLY)
Abs Immature Granulocytes: 0.03 10*3/uL (ref 0.00–0.07)
Basophils Absolute: 0 10*3/uL (ref 0.0–0.1)
Basophils Relative: 0 %
Eosinophils Absolute: 0 10*3/uL (ref 0.0–0.5)
Eosinophils Relative: 0 %
HCT: 27.1 % — ABNORMAL LOW (ref 36.0–46.0)
Hemoglobin: 7.3 g/dL — ABNORMAL LOW (ref 12.0–15.0)
Immature Granulocytes: 0 %
Lymphocytes Relative: 27 %
Lymphs Abs: 2.4 10*3/uL (ref 0.7–4.0)
MCH: 17.5 pg — ABNORMAL LOW (ref 26.0–34.0)
MCHC: 26.9 g/dL — ABNORMAL LOW (ref 30.0–36.0)
MCV: 64.8 fL — ABNORMAL LOW (ref 80.0–100.0)
Monocytes Absolute: 0.6 10*3/uL (ref 0.1–1.0)
Monocytes Relative: 7 %
Neutro Abs: 5.8 10*3/uL (ref 1.7–7.7)
Neutrophils Relative %: 66 %
Platelet Count: 526 10*3/uL — ABNORMAL HIGH (ref 150–400)
RBC: 4.18 MIL/uL (ref 3.87–5.11)
RDW: 23.5 % — ABNORMAL HIGH (ref 11.5–15.5)
WBC Count: 9 10*3/uL (ref 4.0–10.5)
nRBC: 0.3 % — ABNORMAL HIGH (ref 0.0–0.2)

## 2022-01-11 LAB — IRON AND IRON BINDING CAPACITY (CC-WL,HP ONLY)
Iron: 18 ug/dL — ABNORMAL LOW (ref 28–170)
Saturation Ratios: 3 % — ABNORMAL LOW (ref 10.4–31.8)
TIBC: 547 ug/dL — ABNORMAL HIGH (ref 250–450)
UIBC: 529 ug/dL — ABNORMAL HIGH (ref 148–442)

## 2022-01-11 LAB — SAMPLE TO BLOOD BANK

## 2022-01-11 LAB — PREPARE RBC (CROSSMATCH)

## 2022-01-12 ENCOUNTER — Other Ambulatory Visit: Payer: Self-pay | Admitting: Hematology

## 2022-01-12 ENCOUNTER — Inpatient Hospital Stay: Payer: Federal, State, Local not specified - PPO

## 2022-01-12 DIAGNOSIS — Z808 Family history of malignant neoplasm of other organs or systems: Secondary | ICD-10-CM | POA: Diagnosis not present

## 2022-01-12 DIAGNOSIS — Z833 Family history of diabetes mellitus: Secondary | ICD-10-CM | POA: Diagnosis not present

## 2022-01-12 DIAGNOSIS — D649 Anemia, unspecified: Secondary | ICD-10-CM | POA: Diagnosis not present

## 2022-01-12 DIAGNOSIS — Z79899 Other long term (current) drug therapy: Secondary | ICD-10-CM | POA: Diagnosis not present

## 2022-01-12 DIAGNOSIS — K5900 Constipation, unspecified: Secondary | ICD-10-CM | POA: Diagnosis not present

## 2022-01-12 DIAGNOSIS — N920 Excessive and frequent menstruation with regular cycle: Secondary | ICD-10-CM | POA: Diagnosis not present

## 2022-01-12 DIAGNOSIS — R0600 Dyspnea, unspecified: Secondary | ICD-10-CM | POA: Diagnosis not present

## 2022-01-12 DIAGNOSIS — Z8249 Family history of ischemic heart disease and other diseases of the circulatory system: Secondary | ICD-10-CM | POA: Diagnosis not present

## 2022-01-12 DIAGNOSIS — D509 Iron deficiency anemia, unspecified: Secondary | ICD-10-CM

## 2022-01-12 DIAGNOSIS — R194 Change in bowel habit: Secondary | ICD-10-CM | POA: Diagnosis not present

## 2022-01-12 DIAGNOSIS — Z8042 Family history of malignant neoplasm of prostate: Secondary | ICD-10-CM | POA: Diagnosis not present

## 2022-01-12 DIAGNOSIS — Z800 Family history of malignant neoplasm of digestive organs: Secondary | ICD-10-CM | POA: Diagnosis not present

## 2022-01-12 MED ORDER — SODIUM CHLORIDE 0.9% IV SOLUTION
250.0000 mL | Freq: Once | INTRAVENOUS | Status: AC
  Administered 2022-01-12: 250 mL via INTRAVENOUS

## 2022-01-12 MED ORDER — DIPHENHYDRAMINE HCL 25 MG PO CAPS
25.0000 mg | ORAL_CAPSULE | Freq: Once | ORAL | Status: AC
  Administered 2022-01-12: 25 mg via ORAL
  Filled 2022-01-12: qty 1

## 2022-01-12 MED ORDER — SODIUM CHLORIDE 0.9% FLUSH: 10.0000 mL | INTRAVENOUS | Status: DC | PRN

## 2022-01-12 MED ORDER — ACETAMINOPHEN 325 MG PO TABS
650.0000 mg | ORAL_TABLET | Freq: Once | ORAL | Status: AC
  Administered 2022-01-12: 650 mg via ORAL
  Filled 2022-01-12: qty 2

## 2022-01-12 NOTE — Patient Instructions (Signed)
Blood Transfusion, Adult, Care After The following information offers guidance on how to care for yourself after your procedure. Your health care provider may also give you more specific instructions. If you have problems or questions, contact your health care provider. What can I expect after the procedure? After the procedure, it is common to have: Bruising and soreness where the IV was inserted. A headache. Follow these instructions at home: IV insertion site care     Follow instructions from your health care provider about how to take care of your IV insertion site. Make sure you: Wash your hands with soap and water for at least 20 seconds before and after you change your bandage (dressing). If soap and water are not available, use hand sanitizer. Change your dressing as told by your health care provider. Check your IV insertion site every day for signs of infection. Check for: Redness, swelling, or pain. Bleeding from the site. Warmth. Pus or a bad smell. General instructions Take over-the-counter and prescription medicines only as told by your health care provider. Rest as told by your health care provider. Return to your normal activities as told by your health care provider. Keep all follow-up visits. Lab tests may need to be done at certain periods to recheck your blood counts. Contact a health care provider if: You have itching or red, swollen areas of skin (hives). You have a fever or chills. You have pain in the head, back, or chest. You feel anxious or you feel weak after doing your normal activities. You have redness, swelling, warmth, or pain around the IV insertion site. You have blood coming from the IV insertion site that does not stop with pressure. You have pus or a bad smell coming from your IV insertion site. If you received your blood transfusion in an outpatient setting, you will be told whom to contact to report any reactions. Get help right away if: You  have symptoms of a serious allergic or immune system reaction, including: Trouble breathing or shortness of breath. Swelling of the face, feeling flushed, or widespread rash. Dark urine or blood in the urine. Fast heartbeat. These symptoms may be an emergency. Get help right away. Call 911. Do not wait to see if the symptoms will go away. Do not drive yourself to the hospital. Summary Bruising and soreness around the IV insertion site are common. Check your IV insertion site every day for signs of infection. Rest as told by your health care provider. Return to your normal activities as told by your health care provider. Get help right away for symptoms of a serious allergic or immune system reaction to the blood transfusion. This information is not intended to replace advice given to you by your health care provider. Make sure you discuss any questions you have with your health care provider. Document Revised: 08/24/2021 Document Reviewed: 08/24/2021 Elsevier Patient Education  2023 [employer]  

## 2022-01-13 LAB — BPAM RBC
Blood Product Expiration Date: 202309042359
ISSUE DATE / TIME: 202308050842
Unit Type and Rh: 5100

## 2022-01-13 LAB — TYPE AND SCREEN
ABO/RH(D): O POS
Antibody Screen: NEGATIVE
Unit division: 0

## 2022-01-14 ENCOUNTER — Other Ambulatory Visit: Payer: Self-pay | Admitting: General Surgery

## 2022-01-14 DIAGNOSIS — N6311 Unspecified lump in the right breast, upper outer quadrant: Secondary | ICD-10-CM

## 2022-01-14 MED ORDER — TRIAMCINOLONE ACETONIDE 40 MG/ML IJ SUSP: 40.0000 mg | Freq: Once | INTRAMUSCULAR | Status: AC

## 2022-01-15 ENCOUNTER — Other Ambulatory Visit: Payer: Self-pay | Admitting: Pharmacy Technician

## 2022-01-15 ENCOUNTER — Telehealth: Payer: Self-pay | Admitting: Pharmacy Technician

## 2022-01-15 NOTE — Telephone Encounter (Signed)
Dr. Charlies Silvers,  Monoferric is non-preferred medication with [facility] and will likely be denied due to patient has not tried and or failed step therapy. Venofer Feraheme Ferrlecit   Would you like to try feraheme or venofer?

## 2022-01-17 ENCOUNTER — Encounter: Payer: Self-pay | Admitting: Physician Assistant

## 2022-01-17 ENCOUNTER — Telehealth: Payer: Self-pay | Admitting: Pharmacy Technician

## 2022-01-17 NOTE — Telephone Encounter (Signed)
Dr. Charlies Silvers, Juluis Rainier note:  Auth Submission: no auth needed Payer: [employer] Medication & CPT/J Code(s) submitted: Feraheme (ferumoxytol) [id number] Route of submission (phone, fax, portal): PHONE Auth type: Buy/Bill Units/visits requested: X2 Reference number: [id number] Approval from: 01/17/22 to 04/19/22   Patient will be scheduled as soon as possible

## 2022-01-22 ENCOUNTER — Ambulatory Visit (INDEPENDENT_AMBULATORY_CARE_PROVIDER_SITE_OTHER): Payer: Federal, State, Local not specified - PPO

## 2022-01-22 VITALS — BP 124/81 | HR 76 | Temp 99.0°F | Resp 16 | Ht 72.0 in | Wt 254.4 lb

## 2022-01-22 DIAGNOSIS — D509 Iron deficiency anemia, unspecified: Secondary | ICD-10-CM | POA: Diagnosis not present

## 2022-01-22 MED ORDER — SODIUM CHLORIDE 0.9 % IV SOLN
510.0000 mg | Freq: Once | INTRAVENOUS | Status: AC
  Administered 2022-01-22: 510 mg via INTRAVENOUS
  Filled 2022-01-22 (×2): qty 17

## 2022-01-22 NOTE — Progress Notes (Signed)
Diagnosis: Iron Deficiency Anemia  Provider:  Marshell Garfinkel MD  Procedure: Infusion  IV Type: Peripheral, IV Location: L Hand  Feraheme (Ferumoxytol), Dose: 510 mg  Infusion Start Time: 2284  Infusion Stop Time: 1608  Post Infusion IV Care: Observation period completed and Peripheral IV Discontinued  Discharge: Condition: Good, Destination: Home . AVS provided to patient.   Performed by:  Adelina Mings, LPN

## 2022-01-29 ENCOUNTER — Ambulatory Visit: Payer: Federal, State, Local not specified - PPO

## 2022-01-30 ENCOUNTER — Encounter (HOSPITAL_BASED_OUTPATIENT_CLINIC_OR_DEPARTMENT_OTHER): Payer: Self-pay | Admitting: General Surgery

## 2022-01-30 ENCOUNTER — Other Ambulatory Visit: Payer: Self-pay

## 2022-01-30 ENCOUNTER — Ambulatory Visit (INDEPENDENT_AMBULATORY_CARE_PROVIDER_SITE_OTHER): Payer: Federal, State, Local not specified - PPO

## 2022-01-30 VITALS — BP 126/86 | HR 61 | Temp 98.5°F | Resp 18 | Ht 72.0 in | Wt 251.6 lb

## 2022-01-30 DIAGNOSIS — D509 Iron deficiency anemia, unspecified: Secondary | ICD-10-CM | POA: Diagnosis not present

## 2022-01-30 MED ORDER — SODIUM CHLORIDE 0.9 % IV SOLN
510.0000 mg | Freq: Once | INTRAVENOUS | Status: AC
  Administered 2022-01-30: 510 mg via INTRAVENOUS
  Filled 2022-01-30: qty 17

## 2022-01-30 NOTE — Progress Notes (Signed)
Diagnosis: Iron Deficiency Anemia  Provider:  Marshell Garfinkel MD  Procedure: Infusion  IV Type: Peripheral, IV Location: r wrist  Feraheme (Ferumoxytol), Dose: 510 mg  Infusion Start Time: 9629  Infusion Stop Time: 5284  Post Infusion IV Care: Peripheral IV Discontinued  Discharge: Condition: Good, Destination: Home . AVS provided to patient.   Performed by:  Arnoldo Morale, RN

## 2022-02-05 ENCOUNTER — Encounter (HOSPITAL_BASED_OUTPATIENT_CLINIC_OR_DEPARTMENT_OTHER)
Admission: RE | Admit: 2022-02-05 | Discharge: 2022-02-05 | Disposition: A | Discharge status: Home / Self Care | Payer: Federal, State, Local not specified - PPO | Source: Ambulatory Visit | Attending: General Surgery | Admitting: General Surgery

## 2022-02-05 DIAGNOSIS — Z01812 Encounter for preprocedural laboratory examination: Secondary | ICD-10-CM | POA: Insufficient documentation

## 2022-02-05 DIAGNOSIS — N6021 Fibroadenosis of right breast: Secondary | ICD-10-CM | POA: Diagnosis not present

## 2022-02-05 DIAGNOSIS — D759 Disease of blood and blood-forming organs, unspecified: Secondary | ICD-10-CM | POA: Diagnosis not present

## 2022-02-05 DIAGNOSIS — D649 Anemia, unspecified: Secondary | ICD-10-CM | POA: Diagnosis not present

## 2022-02-05 LAB — CBC
HCT: 39.3 % (ref 36.0–46.0)
Hemoglobin: 11.1 g/dL — ABNORMAL LOW (ref 12.0–15.0)
MCH: 21.3 pg — ABNORMAL LOW (ref 26.0–34.0)
MCHC: 28.2 g/dL — ABNORMAL LOW (ref 30.0–36.0)
MCV: 75.3 fL — ABNORMAL LOW (ref 80.0–100.0)
Platelets: 377 10*3/uL (ref 150–400)
RBC: 5.22 MIL/uL — ABNORMAL HIGH (ref 3.87–5.11)
RDW: 30.5 % — ABNORMAL HIGH (ref 11.5–15.5)
WBC: 9.5 10*3/uL (ref 4.0–10.5)
nRBC: 0 % (ref 0.0–0.2)

## 2022-02-05 MED ORDER — ENSURE PRE-SURGERY PO LIQD: 296.0000 mL | Freq: Once | ORAL | Status: DC

## 2022-02-05 NOTE — Progress Notes (Signed)

## 2022-02-06 DIAGNOSIS — R928 Other abnormal and inconclusive findings on diagnostic imaging of breast: Secondary | ICD-10-CM | POA: Diagnosis not present

## 2022-02-07 ENCOUNTER — Encounter (HOSPITAL_BASED_OUTPATIENT_CLINIC_OR_DEPARTMENT_OTHER): Payer: Self-pay | Admitting: General Surgery

## 2022-02-07 ENCOUNTER — Encounter (HOSPITAL_BASED_OUTPATIENT_CLINIC_OR_DEPARTMENT_OTHER)
Admission: RE | Disposition: A | Discharge status: Home / Self Care | Payer: Self-pay | Source: Ambulatory Visit | Attending: General Surgery

## 2022-02-07 ENCOUNTER — Other Ambulatory Visit: Payer: Self-pay

## 2022-02-07 ENCOUNTER — Ambulatory Visit (HOSPITAL_BASED_OUTPATIENT_CLINIC_OR_DEPARTMENT_OTHER): Payer: Federal, State, Local not specified - PPO | Admitting: Certified Registered Nurse Anesthetist

## 2022-02-07 ENCOUNTER — Ambulatory Visit (HOSPITAL_BASED_OUTPATIENT_CLINIC_OR_DEPARTMENT_OTHER)
Admission: RE | Admit: 2022-02-07 | Discharge: 2022-02-07 | Disposition: A | Discharge status: Home / Self Care | Payer: Federal, State, Local not specified - PPO | Source: Ambulatory Visit | Attending: General Surgery | Admitting: General Surgery

## 2022-02-07 DIAGNOSIS — D649 Anemia, unspecified: Secondary | ICD-10-CM | POA: Diagnosis not present

## 2022-02-07 DIAGNOSIS — D759 Disease of blood and blood-forming organs, unspecified: Secondary | ICD-10-CM | POA: Diagnosis not present

## 2022-02-07 DIAGNOSIS — N6311 Unspecified lump in the right breast, upper outer quadrant: Secondary | ICD-10-CM | POA: Diagnosis not present

## 2022-02-07 DIAGNOSIS — N6011 Diffuse cystic mastopathy of right breast: Secondary | ICD-10-CM | POA: Diagnosis not present

## 2022-02-07 DIAGNOSIS — N6310 Unspecified lump in the right breast, unspecified quadrant: Secondary | ICD-10-CM | POA: Diagnosis not present

## 2022-02-07 DIAGNOSIS — S21009A Unspecified open wound of unspecified breast, initial encounter: Secondary | ICD-10-CM | POA: Insufficient documentation

## 2022-02-07 DIAGNOSIS — R928 Other abnormal and inconclusive findings on diagnostic imaging of breast: Secondary | ICD-10-CM | POA: Diagnosis not present

## 2022-02-07 DIAGNOSIS — N6021 Fibroadenosis of right breast: Secondary | ICD-10-CM | POA: Insufficient documentation

## 2022-02-07 DIAGNOSIS — D509 Iron deficiency anemia, unspecified: Secondary | ICD-10-CM

## 2022-02-07 DIAGNOSIS — Z01818 Encounter for other preprocedural examination: Secondary | ICD-10-CM

## 2022-02-07 HISTORY — PX: RADIOACTIVE SEED GUIDED EXCISIONAL BREAST BIOPSY: SHX6490

## 2022-02-07 HISTORY — DX: Iron deficiency anemia, unspecified: D50.9

## 2022-02-07 LAB — POCT PREGNANCY, URINE: Preg Test, Ur: NEGATIVE

## 2022-02-07 SURGERY — RADIOACTIVE SEED GUIDED BREAST BIOPSY
Anesthesia: General | Site: Breast | Laterality: Right

## 2022-02-07 MED ORDER — CELECOXIB 200 MG PO CAPS
ORAL_CAPSULE | ORAL | Status: AC
Start: 2022-02-07 — End: ?
  Filled 2022-02-07: qty 2

## 2022-02-07 MED ORDER — CEFAZOLIN SODIUM-DEXTROSE 2-4 GM/100ML-% IV SOLN
INTRAVENOUS | Status: AC
  Filled 2022-02-07: qty 100

## 2022-02-07 MED ORDER — OXYCODONE HCL 5 MG PO TABS: 5.0000 mg | ORAL_TABLET | Freq: Once | ORAL | Status: DC | PRN

## 2022-02-07 MED ORDER — ONDANSETRON HCL 4 MG/2ML IJ SOLN
INTRAMUSCULAR | Status: DC | PRN
  Administered 2022-02-07: 4 mg via INTRAVENOUS

## 2022-02-07 MED ORDER — OXYCODONE HCL 5 MG/5ML PO SOLN: 5.0000 mg | Freq: Once | ORAL | Status: DC | PRN

## 2022-02-07 MED ORDER — DEXAMETHASONE SODIUM PHOSPHATE 10 MG/ML IJ SOLN
INTRAMUSCULAR | Status: AC
  Filled 2022-02-07: qty 1

## 2022-02-07 MED ORDER — FENTANYL CITRATE (PF) 100 MCG/2ML IJ SOLN: 25.0000 ug | INTRAMUSCULAR | Status: DC | PRN

## 2022-02-07 MED ORDER — PROPOFOL 10 MG/ML IV BOLUS
INTRAVENOUS | Status: AC
  Filled 2022-02-07: qty 20

## 2022-02-07 MED ORDER — FENTANYL CITRATE (PF) 100 MCG/2ML IJ SOLN
INTRAMUSCULAR | Status: DC | PRN
  Administered 2022-02-07: 25 ug via INTRAVENOUS
  Administered 2022-02-07: 50 ug via INTRAVENOUS
  Administered 2022-02-07: 25 ug via INTRAVENOUS

## 2022-02-07 MED ORDER — LIDOCAINE 2% (20 MG/ML) 5 ML SYRINGE
INTRAMUSCULAR | Status: AC
  Filled 2022-02-07: qty 5

## 2022-02-07 MED ORDER — ACETAMINOPHEN 500 MG PO TABS
1000.0000 mg | ORAL_TABLET | ORAL | Status: AC
  Administered 2022-02-07: 1000 mg via ORAL

## 2022-02-07 MED ORDER — CELECOXIB 200 MG PO CAPS
400.0000 mg | ORAL_CAPSULE | ORAL | Status: AC
  Administered 2022-02-07: 400 mg via ORAL

## 2022-02-07 MED ORDER — TRIAMCINOLONE ACETONIDE 40 MG/ML IJ SUSP
INTRAMUSCULAR | Status: DC | PRN
  Administered 2022-02-07: 2.5 mL

## 2022-02-07 MED ORDER — BUPIVACAINE HCL (PF) 0.25 % IJ SOLN
INTRAMUSCULAR | Status: DC | PRN
  Administered 2022-02-07: 17.5 mL

## 2022-02-07 MED ORDER — LIDOCAINE 2% (20 MG/ML) 5 ML SYRINGE
INTRAMUSCULAR | Status: DC | PRN
  Administered 2022-02-07: 100 mg via INTRAVENOUS

## 2022-02-07 MED ORDER — TRIAMCINOLONE ACETONIDE 40 MG/ML IJ SUSP
INTRAMUSCULAR | Status: AC
  Filled 2022-02-07: qty 5

## 2022-02-07 MED ORDER — PHENYLEPHRINE 80 MCG/ML (10ML) SYRINGE FOR IV PUSH (FOR BLOOD PRESSURE SUPPORT)
PREFILLED_SYRINGE | INTRAVENOUS | Status: DC | PRN
  Administered 2022-02-07: 160 ug via INTRAVENOUS

## 2022-02-07 MED ORDER — ONDANSETRON HCL 4 MG/2ML IJ SOLN
INTRAMUSCULAR | Status: AC
  Filled 2022-02-07: qty 2

## 2022-02-07 MED ORDER — ACETAMINOPHEN 500 MG PO TABS
ORAL_TABLET | ORAL | Status: AC
  Filled 2022-02-07: qty 2

## 2022-02-07 MED ORDER — DEXAMETHASONE SODIUM PHOSPHATE 10 MG/ML IJ SOLN
INTRAMUSCULAR | Status: DC | PRN
  Administered 2022-02-07: 10 mg via INTRAVENOUS

## 2022-02-07 MED ORDER — MIDAZOLAM HCL 5 MG/5ML IJ SOLN
INTRAMUSCULAR | Status: DC | PRN
  Administered 2022-02-07: 2 mg via INTRAVENOUS

## 2022-02-07 MED ORDER — CEFAZOLIN SODIUM-DEXTROSE 2-4 GM/100ML-% IV SOLN
2.0000 g | INTRAVENOUS | Status: AC
  Administered 2022-02-07: 2 g via INTRAVENOUS

## 2022-02-07 MED ORDER — PROPOFOL 10 MG/ML IV BOLUS
INTRAVENOUS | Status: DC | PRN
  Administered 2022-02-07: 200 mg via INTRAVENOUS

## 2022-02-07 MED ORDER — LACTATED RINGERS IV SOLN: INTRAVENOUS | Status: DC

## 2022-02-07 MED ORDER — CHLORHEXIDINE GLUCONATE CLOTH 2 % EX PADS: 6.0000 | MEDICATED_PAD | Freq: Once | CUTANEOUS | Status: DC

## 2022-02-07 MED ORDER — FENTANYL CITRATE (PF) 100 MCG/2ML IJ SOLN
INTRAMUSCULAR | Status: AC
  Filled 2022-02-07: qty 2

## 2022-02-07 MED ORDER — MIDAZOLAM HCL 2 MG/2ML IJ SOLN
INTRAMUSCULAR | Status: AC
  Filled 2022-02-07: qty 2

## 2022-02-07 SURGICAL SUPPLY — 52 items
ADH SKN CLS APL DERMABOND .7 (GAUZE/BANDAGES/DRESSINGS) ×1
APL PRP STRL LF DISP 70% ISPRP (MISCELLANEOUS) ×1
APPLIER CLIP 9.375 MED OPEN (MISCELLANEOUS)
APR CLP MED 9.3 20 MLT OPN (MISCELLANEOUS)
BINDER BREAST XLRG (GAUZE/BANDAGES/DRESSINGS) IMPLANT
BINDER BREAST XXLRG (GAUZE/BANDAGES/DRESSINGS) IMPLANT
BLADE SURG 15 STRL LF DISP TIS (BLADE) ×2 IMPLANT
BLADE SURG 15 STRL SS (BLADE) ×1
CANISTER SUC SOCK COL 7IN (MISCELLANEOUS) IMPLANT
CANISTER SUCT 1200ML W/VALVE (MISCELLANEOUS) IMPLANT
CHLORAPREP W/TINT 26 (MISCELLANEOUS) ×2 IMPLANT
CLIP APPLIE 9.375 MED OPEN (MISCELLANEOUS) IMPLANT
COVER BACK TABLE 60X90IN (DRAPES) ×2 IMPLANT
COVER MAYO STAND STRL (DRAPES) ×2 IMPLANT
COVER PROBE W GEL 5X96 (DRAPES) ×2 IMPLANT
DERMABOND ADVANCED (GAUZE/BANDAGES/DRESSINGS) ×1
DERMABOND ADVANCED .7 DNX12 (GAUZE/BANDAGES/DRESSINGS) ×2 IMPLANT
DRAPE LAPAROSCOPIC ABDOMINAL (DRAPES) ×2 IMPLANT
DRAPE UTILITY XL STRL (DRAPES) ×2 IMPLANT
ELECT COATED BLADE 2.86 ST (ELECTRODE) ×2 IMPLANT
ELECT REM PT RETURN 9FT ADLT (ELECTROSURGICAL) ×1
ELECTRODE REM PT RTRN 9FT ADLT (ELECTROSURGICAL) ×2 IMPLANT
GLOVE BIOGEL PI IND STRL 7.5 (GLOVE) ×2 IMPLANT
GLOVE BIOGEL PI INDICATOR 7.5 (GLOVE) ×1
GLOVE SURG SS PI 6.5 STRL IVOR (GLOVE) IMPLANT
GLOVE SURG SS PI 7.0 STRL IVOR (GLOVE) IMPLANT
GOWN STRL REUS W/ TWL LRG LVL3 (GOWN DISPOSABLE) ×4 IMPLANT
GOWN STRL REUS W/TWL LRG LVL3 (GOWN DISPOSABLE) ×2
HEMOSTAT ARISTA ABSORB 3G PWDR (HEMOSTASIS) IMPLANT
KIT MARKER MARGIN INK (KITS) ×2 IMPLANT
NDL HYPO 25X1 1.5 SAFETY (NEEDLE) ×2 IMPLANT
NEEDLE HYPO 25X1 1.5 SAFETY (NEEDLE) ×1 IMPLANT
NS IRRIG 1000ML POUR BTL (IV SOLUTION) IMPLANT
PACK BASIN DAY SURGERY FS (CUSTOM PROCEDURE TRAY) ×2 IMPLANT
PENCIL SMOKE EVACUATOR (MISCELLANEOUS) ×2 IMPLANT
RETRACTOR ONETRAX LX 90X20 (MISCELLANEOUS) IMPLANT
SLEEVE SCD COMPRESS KNEE MED (STOCKING) ×2 IMPLANT
SPIKE FLUID TRANSFER (MISCELLANEOUS) IMPLANT
SPONGE T-LAP 4X18 ~~LOC~~+RFID (SPONGE) ×2 IMPLANT
STRIP CLOSURE SKIN 1/2X4 (GAUZE/BANDAGES/DRESSINGS) ×2 IMPLANT
SUT MNCRL AB 4-0 PS2 18 (SUTURE) IMPLANT
SUT MON AB 5-0 PS2 18 (SUTURE) IMPLANT
SUT SILK 2 0 SH (SUTURE) IMPLANT
SUT VIC AB 2-0 SH 27 (SUTURE) ×1
SUT VIC AB 2-0 SH 27XBRD (SUTURE) ×2 IMPLANT
SUT VIC AB 3-0 SH 27 (SUTURE) ×1
SUT VIC AB 3-0 SH 27X BRD (SUTURE) ×2 IMPLANT
SYR CONTROL 10ML LL (SYRINGE) ×2 IMPLANT
TOWEL GREEN STERILE FF (TOWEL DISPOSABLE) ×2 IMPLANT
TRAY FAXITRON CT DISP (TRAY / TRAY PROCEDURE) ×2 IMPLANT
TUBE CONNECTING 20X1/4 (TUBING) IMPLANT
YANKAUER SUCT BULB TIP NO VENT (SUCTIONS) IMPLANT

## 2022-02-07 NOTE — H&P (Signed)
  41 year old female who is otherwise very healthy who presents after a screening mammogram. She has no family history or any personal history of breast disease. She had no mass or discharge. Her mammogram shows B density breast. She had a mass in the upper may be slightly inner right breast. A diagnostic mammogram showed a 1 cm irregular mass there. There is no sonographic correlate. She underwent a biopsy that shows a low-grade spindle proliferation consistent with fibromatosis. She is here with her husband for evaluation.she does have history of keloid formation.  Review of Systems: A complete review of systems was obtained from the patient. I have reviewed this information and discussed as appropriate with the patient. See HPI as well for other ROS.  Review of Systems  All other systems reviewed and are negative.   Medical History: Past Medical History:  Diagnosis Date  Anemia   Past Surgical History:  Procedure Laterality Date  CESAREAN SECTION  LAPAROSCOPIC MYOMECTOMY   Current Outpatient Medications on File Prior to Visit  Medication Sig Dispense Refill  multivitamin tablet Take 1 tablet by mouth once daily   Family History  Problem Relation Age of Onset  Heart valve disease Father  Diabetes Father    Social History   Tobacco Use  Smoking Status Never  Smokeless Tobacco Never    Social History   Socioeconomic History  Marital status: Married  Tobacco Use  Smoking status: Never  Smokeless tobacco: Never  Vaping Use  Vaping Use: Never used  Substance and Sexual Activity  Alcohol use: Yes  Drug use: Never   Objective:   Vitals:  01/14/22 0950  BP: (!) 158/90  Pulse: 110  Temp: 36.3 C (97.3 F)  SpO2: 99%  Weight: (!) 114.9 kg (253 lb 6.4 oz)  Height: 208.3 cm ('6\' 10"'$ )   Body mass index is 26.5 kg/m.  Physical Exam Vitals reviewed.  Constitutional:  Appearance: Normal appearance.  Chest:  Breasts: Right: No inverted nipple, mass or nipple  discharge.  Left: No inverted nipple, mass or nipple discharge.  Lymphadenopathy:  Upper Body:  Right upper body: No supraclavicular or axillary adenopathy.  Left upper body: No supraclavicular or axillary adenopathy.  Neurological:  Mental Status: She is alert.    Assessment and Plan:   Mass of upper outer quadrant of right breast   Right breast radioactive seed guided excisional biopsy  We discussed the indications for excision being that the core biopsy shows possible fibromatosis. We discussed excluding another lesion as well as the fact that it needs to be excised and can be locally an issue. We discussed a seed guided excisional biopsy with the risks and recovery associated with that and we will proceed.

## 2022-02-07 NOTE — Discharge Instructions (Addendum)
[facility] Phone Number [phone]  POST OP INSTRUCTIONS Take 400 mg of ibuprofen every 8 hours or 650 mg tylenol every 6 hours for next 72 hours then as needed. Use ice several times daily also.  A prescription for pain medication may be given to you upon discharge.  Take your pain medication as prescribed, if needed.  If narcotic pain medicine is not needed, then you may take acetaminophen (Tylenol), naprosyn (Alleve) or ibuprofen (Advil) as needed. Take your usually prescribed medications unless otherwise directed If you need a refill on your pain medication, please contact your pharmacy.  They will contact our office to request authorization.  Prescriptions will not be filled after 5pm or on week-ends. You should eat very light the first 24 hours after surgery, such as soup, crackers, pudding, etc.  Resume your normal diet the day after surgery. Most patients will experience some swelling and bruising in the breast.  Ice packs and a good support bra will help.  Wear the breast binder provided or a sports bra for 72 hours day and night.  After that wear a sports bra during the day until you return to the office. Swelling and bruising can take several days to resolve.  It is common to experience some constipation if taking pain medication after surgery.  Increasing fluid intake and taking a stool softener will usually help or prevent this problem from occurring.  A mild laxative (Milk of Magnesia or Miralax) should be taken according to package directions if there are no bowel movements after 48 hours. I used skin glue on the incision, you may shower in 24 hours.  The glue will flake off over the next 2-3 weeks.  Any sutures or staples will be removed at the office during your follow-up visit. ACTIVITIES:  You may resume regular daily activities (gradually increasing) beginning the next day.  Wearing a good support bra or sports bra minimizes pain and swelling.  You may have  sexual intercourse when it is comfortable. You may drive when you no longer are taking prescription pain medication, you can comfortably wear a seatbelt, and you can safely maneuver your car and apply brakes. RETURN TO WORK:  ______________________________________________________________________________________ Dennis Bast should see your doctor in the office for a follow-up appointment approximately two weeks after your surgery.  Your doctor's nurse will typically make your follow-up appointment when she calls you with your pathology report.  Expect your pathology report 3-4 business days after your surgery.  You may call to check if you do not hear from [place] after three days. OTHER INSTRUCTIONS: _______________________________________________________________________________________________ _____________________________________________________________________________________________________________________________________ _____________________________________________________________________________________________________________________________________ _____________________________________________________________________________________________________________________________________  WHEN TO CALL DR WAKEFIELD: Fever over 101.0 Nausea and/or vomiting. Extreme swelling or bruising. Continued bleeding from incision. Increased pain, redness, or drainage from the incision.  The clinic staff is available to answer your questions during regular business hours.  Please don't hesitate to call and ask to speak to one of the nurses for clinical concerns.  If you have a medical emergency, go to the nearest emergency room or call 911.  A surgeon from [facility] is always on call at the hospital.  For further questions, please visit [email or web address]  Post Anesthesia Home Care Instructions  Activity: Get plenty of rest for the remainder of the day. A responsible individual must stay with  you for 24 hours following the procedure.  For the next 24 hours, DO NOT: -Drive a car -Paediatric nurse -Drink alcoholic beverages -Take any medication unless instructed by your  physician -Make any legal decisions or sign important papers.  Meals: Start with liquid foods such as gelatin or soup. Progress to regular foods as tolerated. Avoid greasy, spicy, heavy foods. If nausea and/or vomiting occur, drink only clear liquids until the nausea and/or vomiting subsides. Call your physician if vomiting continues.  Special Instructions/Symptoms: Your throat may feel dry or sore from the anesthesia or the breathing tube placed in your throat during surgery. If this causes discomfort, gargle with warm salt water. The discomfort should disappear within 24 hours.  If you had a scopolamine patch placed behind your ear for the management of post- operative nausea and/or vomiting:  1. The medication in the patch is effective for 72 hours, after which it should be removed.  Wrap patch in a tissue and discard in the trash. Wash hands thoroughly with soap and water. 2. You may remove the patch earlier than 72 hours if you experience unpleasant side effects which may include dry mouth, dizziness or visual disturbances. 3. Avoid touching the patch. Wash your hands with soap and water after contact with the patch.  *May have Tylenol/Ibuprofen at 3:15pm today

## 2022-02-07 NOTE — Op Note (Addendum)
Preoperative diagnosis: Right breast mass Postoperative diagnosis: Same as above Procedure  Right breast seed guided excisional biopsy Surgeon: Dr. Serita Grammes Anesthesia: General  Estimated blood loss: minimal Specimens: Right breast tissue containing seed and clip marked with paint Complications: None Drains: None Special count was correct completion Disposition to recovery in stable condition  Indications: 42 year old female who is otherwise very healthy who presents after a screening mammogram. She had no mass or discharge.  She had a mass in the upper slightly inner right breast. A diagnostic mammogram showed a 1 cm irregular mass there. There is no sonographic correlate. She underwent a biopsy that shows a low-grade spindle proliferation consistent with fibromatosis. We discussed seed guided excision.   Procedure: After informed consent was obtained the patient was taken to the OR.  She was given antibiotics.  SCDs were in place.  She was placed under general anesthesia without complication.  She was prepped and draped in the standard sterile surgical fashion.  Surgical timeout was then performed.   I identified the seed in the lcentral upper  breast.  I infiltrated a mixture of marcaine/kenalog (due to history of keloid formation) and then amde a periareolar incision to hide the scar later.  I then dissected to the seed. I removed the seed and some of surrounding tissue. Mammogram confirmed removal of the seed and the clip.   Hemostasis was observed. I closed the breast tissue with 2-0 vicryl.I then closed skin with 3-0 vicryl and 5-0 monocryl. I infiltrated kenalog into the entire incision again when complete. Glue was placed.   she tolerated well, was extubated and transferred to recovery stable

## 2022-02-07 NOTE — Anesthesia Postprocedure Evaluation (Signed)
Anesthesia Post Note  Patient: Melanie [name]  Procedure(s) Performed: RADIOACTIVE SEED GUIDED EXCISIONAL RIGHT BREAST BIOPSY (Right: Breast)     Patient location during evaluation: PACU Anesthesia Type: General Level of consciousness: awake and alert Pain management: pain level controlled Vital Signs Assessment: post-procedure vital signs reviewed and stable Respiratory status: spontaneous breathing, nonlabored ventilation, respiratory function stable and patient connected to nasal cannula oxygen Cardiovascular status: blood pressure returned to baseline and stable Postop Assessment: no apparent nausea or vomiting Anesthetic complications: no   No notable events documented.  Last Vitals:  Vitals:   02/07/22 1145 02/07/22 1225  BP: 101/62 114/70  Pulse: (!) 57 64  Resp: (!) 9 12  Temp:  (!) 36.3 C  SpO2: 100% 100%    Last Pain:  Vitals:   02/07/22 1225  TempSrc:   PainSc: 2                  March Rummage Jaunita Mikels

## 2022-02-07 NOTE — Interval H&P Note (Signed)
History and Physical Interval Note:  02/07/2022 9:56 AM  Melanie [name]  has presented today for surgery, with the diagnosis of RIGHT BREAST MASS.  The various methods of treatment have been discussed with the patient and family. After consideration of risks, benefits and other options for treatment, the patient has consented to  Procedure(s): RADIOACTIVE SEED GUIDED EXCISIONAL RIGHT BREAST BIOPSY (Right) as a surgical intervention.  The patient's history has been reviewed, patient examined, no change in status, stable for surgery.  I have reviewed the patient's chart and labs.  Questions were answered to the patient's satisfaction.     Rolm Bookbinder

## 2022-02-07 NOTE — Anesthesia Preprocedure Evaluation (Signed)
Anesthesia Evaluation  Patient identified by MRN, date of birth, ID band Patient awake    Reviewed: Allergy & Precautions, NPO status , Patient's Chart, lab work & pertinent test results  Airway Mallampati: II  TM Distance: >3 FB Neck ROM: Full    Dental no notable dental hx.    Pulmonary neg pulmonary ROS,    Pulmonary exam normal        Cardiovascular negative cardio ROS   Rhythm:Regular Rate:Normal     Neuro/Psych negative neurological ROS  negative psych ROS   GI/Hepatic negative GI ROS, Neg liver ROS,   Endo/Other  negative endocrine ROS  Renal/GU negative Renal ROS  negative genitourinary   Musculoskeletal Right breast mass   Abdominal Normal abdominal exam  (+)   Peds  Hematology  (+) Blood dyscrasia, anemia ,   Anesthesia Other Findings   Reproductive/Obstetrics                             Anesthesia Physical Anesthesia Plan  ASA: 2  Anesthesia Plan: General   Post-op Pain Management:    Induction: Intravenous  PONV Risk Score and Plan: 3 and Ondansetron, Dexamethasone, Midazolam and Treatment may vary due to age or medical condition  Airway Management Planned: Mask and LMA  Additional Equipment: None  Intra-op Plan:   Post-operative Plan: Extubation in OR  Informed Consent: I have reviewed the patients History and Physical, chart, labs and discussed the procedure including the risks, benefits and alternatives for the proposed anesthesia with the patient or authorized representative who has indicated his/her understanding and acceptance.     Dental advisory given  Plan Discussed with: CRNA  Anesthesia Plan Comments: (Lab Results      Component                Value               Date                      WBC                      9.5                 02/05/2022                HGB                      11.1 (L)            02/05/2022                HCT                       39.3                02/05/2022                MCV                      75.3 (L)            02/05/2022                PLT                      377  02/05/2022          )        Anesthesia Quick Evaluation

## 2022-02-07 NOTE — Anesthesia Procedure Notes (Signed)
Procedure Name: LMA Insertion Date/Time: 02/07/2022 10:24 AM  Performed by: Genelle Bal, CRNAPre-anesthesia Checklist: Patient identified, Emergency Drugs available, Suction available and Patient being monitored Patient Re-evaluated:Patient Re-evaluated prior to induction Oxygen Delivery Method: Circle system utilized Preoxygenation: Pre-oxygenation with 100% oxygen Induction Type: IV induction Ventilation: Mask ventilation without difficulty LMA: LMA inserted LMA Size: 4.0 Number of attempts: 1 Airway Equipment and Method: Bite block Placement Confirmation: positive ETCO2 Tube secured with: Tape Dental Injury: Teeth and Oropharynx as per pre-operative assessment

## 2022-02-07 NOTE — Transfer of Care (Signed)
Immediate Anesthesia Transfer of Care Note  Patient: Melanie [name]  Procedure(s) Performed: RADIOACTIVE SEED GUIDED EXCISIONAL RIGHT BREAST BIOPSY (Right: Breast)  Patient Location: PACU  Anesthesia Type:General  Level of Consciousness: awake, alert  and oriented  Airway & Oxygen Therapy: Patient Spontanous Breathing and Patient connected to face mask oxygen  Post-op Assessment: Report given to RN and Post -op Vital signs reviewed and stable  Post vital signs: Reviewed and stable  Last Vitals:  Vitals Value Taken Time  BP    Temp    Pulse 75 02/07/22 1111  Resp 12 02/07/22 1111  SpO2 100 % 02/07/22 1111  Vitals shown include unvalidated device data.  Last Pain:  Vitals:   02/07/22 0912  TempSrc: Oral  PainSc: 0-No pain      Patients Stated Pain Goal: 7 (17/79/39 0300)  Complications: No notable events documented.

## 2022-02-08 ENCOUNTER — Encounter (HOSPITAL_BASED_OUTPATIENT_CLINIC_OR_DEPARTMENT_OTHER): Payer: Self-pay | Admitting: General Surgery

## 2022-02-11 ENCOUNTER — Other Ambulatory Visit: Payer: Self-pay | Admitting: Physician Assistant

## 2022-02-11 DIAGNOSIS — D509 Iron deficiency anemia, unspecified: Secondary | ICD-10-CM

## 2022-02-12 ENCOUNTER — Other Ambulatory Visit: Payer: Self-pay

## 2022-02-12 ENCOUNTER — Inpatient Hospital Stay: Payer: Federal, State, Local not specified - PPO | Attending: Physician Assistant

## 2022-02-12 DIAGNOSIS — Z79899 Other long term (current) drug therapy: Secondary | ICD-10-CM | POA: Diagnosis not present

## 2022-02-12 DIAGNOSIS — C50411 Malignant neoplasm of upper-outer quadrant of right female breast: Secondary | ICD-10-CM | POA: Insufficient documentation

## 2022-02-12 DIAGNOSIS — D509 Iron deficiency anemia, unspecified: Secondary | ICD-10-CM

## 2022-02-12 DIAGNOSIS — D649 Anemia, unspecified: Secondary | ICD-10-CM | POA: Diagnosis not present

## 2022-02-12 LAB — CBC WITH DIFFERENTIAL (CANCER CENTER ONLY)
Abs Immature Granulocytes: 0.04 10*3/uL (ref 0.00–0.07)
Basophils Absolute: 0 10*3/uL (ref 0.0–0.1)
Basophils Relative: 0 %
Eosinophils Absolute: 0.2 10*3/uL (ref 0.0–0.5)
Eosinophils Relative: 2 %
HCT: 38.9 % (ref 36.0–46.0)
Hemoglobin: 11.8 g/dL — ABNORMAL LOW (ref 12.0–15.0)
Immature Granulocytes: 0 %
Lymphocytes Relative: 21 %
Lymphs Abs: 2.4 10*3/uL (ref 0.7–4.0)
MCH: 22.5 pg — ABNORMAL LOW (ref 26.0–34.0)
MCHC: 30.3 g/dL (ref 30.0–36.0)
MCV: 74.2 fL — ABNORMAL LOW (ref 80.0–100.0)
Monocytes Absolute: 0.5 10*3/uL (ref 0.1–1.0)
Monocytes Relative: 4 %
Neutro Abs: 8.4 10*3/uL — ABNORMAL HIGH (ref 1.7–7.7)
Neutrophils Relative %: 73 %
Platelet Count: 455 10*3/uL — ABNORMAL HIGH (ref 150–400)
RBC: 5.24 MIL/uL — ABNORMAL HIGH (ref 3.87–5.11)
RDW: 29.8 % — ABNORMAL HIGH (ref 11.5–15.5)
WBC Count: 11.6 10*3/uL — ABNORMAL HIGH (ref 4.0–10.5)
nRBC: 0 % (ref 0.0–0.2)

## 2022-02-12 LAB — CMP (CANCER CENTER ONLY)
ALT: 21 U/L (ref 0–44)
AST: 13 U/L — ABNORMAL LOW (ref 15–41)
Albumin: 4.5 g/dL (ref 3.5–5.0)
Alkaline Phosphatase: 82 U/L (ref 38–126)
Anion gap: 7 (ref 5–15)
BUN: 15 mg/dL (ref 6–20)
CO2: 27 mmol/L (ref 22–32)
Calcium: 9.9 mg/dL (ref 8.9–10.3)
Chloride: 101 mmol/L (ref 98–111)
Creatinine: 0.8 mg/dL (ref 0.44–1.00)
GFR, Estimated: >60 mL/min (ref >60)
Glucose, Bld: 138 mg/dL — ABNORMAL HIGH (ref 70–99)
Potassium: 3.8 mmol/L (ref 3.5–5.1)
Sodium: 135 mmol/L (ref 135–145)
Total Bilirubin: 0.3 mg/dL (ref 0.3–1.2)
Total Protein: 7.5 g/dL (ref 6.5–8.1)

## 2022-02-12 LAB — FERRITIN: Ferritin: 89 ng/mL (ref 11–307)

## 2022-02-12 LAB — IRON AND IRON BINDING CAPACITY (CC-WL,HP ONLY)
Iron: 48 ug/dL (ref 28–170)
Saturation Ratios: 11 % (ref 10.4–31.8)
TIBC: 438 ug/dL (ref 250–450)
UIBC: 390 ug/dL (ref 148–442)

## 2022-02-13 ENCOUNTER — Encounter (HOSPITAL_COMMUNITY): Payer: Self-pay

## 2022-02-13 LAB — SURGICAL PATHOLOGY

## 2022-02-19 ENCOUNTER — Telehealth: Payer: Self-pay

## 2022-02-19 NOTE — Telephone Encounter (Signed)
Pt advised with VU and Oct appt confirmed

## 2022-02-19 NOTE — Telephone Encounter (Signed)
-----   Message from Lincoln Brigham, PA-C sent at 02/18/2022  4:31 PM EDT ----- Please notify patient that iron levels are improving. We will repeat again in 4 weeks at follow up visit.  ----- Message ----- From: [place]: 02/12/2022   2:35 PM EDT To: Lincoln Brigham, PA-C

## 2022-02-20 DIAGNOSIS — R87810 Cervical high risk human papillomavirus (HPV) DNA test positive: Secondary | ICD-10-CM | POA: Diagnosis not present

## 2022-02-20 DIAGNOSIS — R87610 Atypical squamous cells of undetermined significance on cytologic smear of cervix (ASC-US): Secondary | ICD-10-CM | POA: Diagnosis not present

## 2022-02-20 DIAGNOSIS — N870 Mild cervical dysplasia: Secondary | ICD-10-CM | POA: Diagnosis not present

## 2022-02-28 DIAGNOSIS — R350 Frequency of micturition: Secondary | ICD-10-CM | POA: Diagnosis not present

## 2022-03-05 DIAGNOSIS — N920 Excessive and frequent menstruation with regular cycle: Secondary | ICD-10-CM | POA: Diagnosis not present

## 2022-03-05 DIAGNOSIS — D251 Intramural leiomyoma of uterus: Secondary | ICD-10-CM | POA: Diagnosis not present

## 2022-03-05 DIAGNOSIS — N870 Mild cervical dysplasia: Secondary | ICD-10-CM | POA: Diagnosis not present

## 2022-03-11 ENCOUNTER — Other Ambulatory Visit: Payer: Self-pay | Admitting: Obstetrics and Gynecology

## 2022-03-11 DIAGNOSIS — D259 Leiomyoma of uterus, unspecified: Secondary | ICD-10-CM

## 2022-03-11 DIAGNOSIS — R350 Frequency of micturition: Secondary | ICD-10-CM | POA: Diagnosis not present

## 2022-03-12 ENCOUNTER — Encounter (HOSPITAL_COMMUNITY): Payer: Self-pay

## 2022-03-14 ENCOUNTER — Ambulatory Visit
Admission: RE | Admit: 2022-03-14 | Discharge: 2022-03-14 | Disposition: A | Discharge status: Home / Self Care | Payer: Federal, State, Local not specified - PPO | Source: Ambulatory Visit | Attending: Obstetrics and Gynecology | Admitting: Obstetrics and Gynecology

## 2022-03-14 DIAGNOSIS — D259 Leiomyoma of uterus, unspecified: Secondary | ICD-10-CM

## 2022-03-14 NOTE — Consult Note (Signed)
Chief Complaint: Symptomatic uterine fibroids.  Referring Physician(s): Cousins,Sheronette  History of Present Illness: Melanie [name] is a 41 y.o. (G1, P5) female with no significant past medical history who has been referred for percutaneous management of symptomatic uterine fibroids.  Patient has a long history of symptomatic uterine fibroids, previously undergoing a myomectomy in 2015, performed primarily for complaints of pelvic pain.  The myomectomy resulted in resolution of her pelvic pain and the patient was able to conceive a child.  Unfortunately, for the past year the patient has experienced recurrent fibroid related complaints, presently centered around menorrhagia.  She states that while her cycle is regular, lasting approximately 7 days, at least 2 days of the cycle are associated with excessive menorrhagia including necessitating the changing of pads and tampons every 2 hours.  She reports passage of blood clots.  She does report intermittent spotting between her cycles.  Patient is anemic and has previously required a blood transfusion.  The patient is not interested in undergoing medical management of her symptomatic fibroids however is interested in any and all additional available treatment options.  The patient is not interested in having additional children.  Past Medical History:  Diagnosis Date   Fibroid    Hx of varicella    Microcytic anemia    Vaginal Pap smear, abnormal     Past Surgical History:  Procedure Laterality Date   CESAREAN SECTION N/A 11/23/2015   Procedure: Primary CESAREAN SECTION;  Surgeon: Servando Salina, MD;  Location: [facility];  Service: Obstetrics;  Laterality: N/A;  EDD: 12/13/15   LAPAROTOMY N/A 12/04/2013   Procedure: Exploratory Laparotomy with Evacuation Hematoma ;  Surgeon: Marvene Staff, MD;  Location: [facility];  Service: Gynecology;  Laterality: N/A;   MYOMECTOMY N/A 12/03/2013   Procedure: Exploratory  Laparotomy MYOMECTOMY;  Surgeon: Marvene Staff, MD;  Location: [facility];  Service: Gynecology;  Laterality: N/A;   RADIOACTIVE SEED GUIDED EXCISIONAL BREAST BIOPSY Right 02/07/2022   Procedure: RADIOACTIVE SEED GUIDED EXCISIONAL RIGHT BREAST BIOPSY;  Surgeon: Rolm Bookbinder, MD;  Location: [facility];  Service: General;  Laterality: Right;    Allergies: Latex and Nickel  Medications: Prior to Admission medications   Medication Sig Start Date End Date Taking? Authorizing Provider  Multiple Vitamin (MULTIVITAMIN) tablet Take 1 tablet by mouth daily.    [provider]  UNABLE TO FIND Med Name: 2 iron infusions- 2nd one scheduled today (01/30/22) at 1400    [provider]     Family History  Problem Relation Age of Onset   Cancer Father        prostate   Diabetes Father    Heart disease Father    Multiple myeloma Father    Cancer Paternal Grandmother        colon   Diabetes Paternal Grandmother     Social History   Socioeconomic History   Marital status: Married    Spouse name: Not on file   Number of children: Not on file   Years of education: Not on file   Highest education level: Not on file  Occupational History   Not on file  Tobacco Use   Smoking status: Never   Smokeless tobacco: Never  Vaping Use   Vaping Use: Never used  Substance and Sexual Activity   Alcohol use: Yes    Comment: socially   Drug use: No   Sexual activity: Yes    Birth control/protection: None  Other Topics Concern   Not on file  Social History Narrative   Not on file   Social Determinants of Health   Financial Resource Strain: Not on file  Food Insecurity: Not on file  Transportation Needs: Not on file  Physical Activity: Not on file  Stress: Not on file  Social Connections: Not on file    ECOG Status: 1 - Symptomatic but completely ambulatory  Review of Systems  Review of Systems: A 12 point ROS discussed and pertinent positives are  indicated in the HPI above.  All other systems are negative.    Physical Exam No direct physical exam was performed (except for noted visual exam findings with Video Visits).   Vital Signs: There were no vitals taken for this visit.  Imaging: No results found.  Labs:  CBC: Recent Labs    01/11/22 1449 02/05/22 1300 02/12/22 1417  WBC 9.0 9.5 11.6*  HGB 7.3* 11.1* 11.8*  HCT 27.1* 39.3 38.9  PLT 526* 377 455*    COAGS: No results for input(s): "INR", "APTT" in the last 8760 hours.  BMP: Recent Labs    01/11/22 1449 02/12/22 1417  NA 137 135  K 3.9 3.8  CL 103 101  CO2 28 27  GLUCOSE 80 138*  BUN 9 15  CALCIUM 9.1 9.9  CREATININE 0.85 0.80  GFRNONAA >60 >60    LIVER FUNCTION TESTS: Recent Labs    01/11/22 1449 02/12/22 1417  BILITOT 0.3 0.3  AST 26 13*  ALT 39 21  ALKPHOS 75 82  PROT 7.6 7.5  ALBUMIN 4.4 4.5    TUMOR MARKERS: No results for input(s): "AFPTM", "CEA", "CA199", "CHROMGRNA" in the last 8760 hours.  Assessment and Plan:  Melanie [name] is a 41 y.o. (G1, P36) female with no significant past medical history who has been referred for percutaneous management of symptomatic uterine fibroids.  While previously her fibroids related complaints were associate with pelvic pain, this resolved following undergo a successful myomectomy in 2015.  Presently, her fibroid related complaint is associated with excessive menorrhagia including the passage of clots and her associated anemia, requiring a blood transfusion.    Prolonged conversations were held with the patient regarding potential treatment options for her symptomatic uterine fibroids including continued conservative management and hysterectomy.  The patient is not interested in undergoing medical management of her symptomatic fibroids however is interested in any and all additional available treatment options.  As such, prolonged conversations were held with the patient regarding the  benefits and risks (including but not limited to bleeding, vessel injury, nontarget embolization, infection, contrast and radiation exposure) of uterine fibroid embolization.   I explained that she will continue to experience a cycle from the uterine embolization however I am hopeful approximately 3 to 6 months following the embolization she will notice significant improvement in her preprocedural menorrhagia.  I explained that given her young age there is a slim possibility she could regrow symptomatic fibroids prior to the onset of menopause however I am hopeful she would have a durable result until the onset of menopause.  Following this prolonged and detailed conversation, the patient wishes to continue weighing her treatment options but is leaning towards proceeding with definitive hysterectomy.  She will call the interventional radiology clinic if she ultimately decides to pursue uterine fibroid embolization.  Plan: - The patient will continue weighing her treatment options and knows to call the interventional radiology clinic if she decides to proceed with uterine fibroid embolization.  If  she decides to proceed with embolization, we will arrange for the patient to have a repeat telemedicine consultation following acquisition of a Pap smear, endometrial biopsy and contrast-enhanced pelvic MRI.  Thank you for this interesting consult.  I greatly enjoyed meeting ISAURA SCHILLER and look forward to participating in their care.  A copy of this report was sent to the requesting provider on this date.  Electronically Signed: Sandi Mariscal 03/14/2022, 3:12 PM   I spent a total of 30 Minutes in remote  clinical consultation, greater than 50% of which was counseling/coordinating care for symptomatic uterine fibroids.    Visit type: Audio only (telephone). Audio (no video) only due to patient's lack of internet/smartphone capability. Alternative for in-person consultation at [facility], [address], [place], [place]. This visit type was conducted due to national recommendations for restrictions regarding the COVID-19 Pandemic (e.g. social distancing).  This format is felt to be most appropriate for this patient at this time.  All issues noted in this document were discussed and addressed.

## 2022-03-19 ENCOUNTER — Other Ambulatory Visit: Payer: Federal, State, Local not specified - PPO

## 2022-03-19 ENCOUNTER — Ambulatory Visit: Payer: Federal, State, Local not specified - PPO | Admitting: Physician Assistant

## 2022-03-22 ENCOUNTER — Ambulatory Visit: Payer: Federal, State, Local not specified - PPO | Admitting: Physician Assistant

## 2022-03-22 ENCOUNTER — Other Ambulatory Visit: Payer: Federal, State, Local not specified - PPO

## 2022-03-25 ENCOUNTER — Other Ambulatory Visit: Payer: Self-pay | Admitting: Physician Assistant

## 2022-03-25 DIAGNOSIS — D509 Iron deficiency anemia, unspecified: Secondary | ICD-10-CM

## 2022-03-26 ENCOUNTER — Inpatient Hospital Stay (HOSPITAL_BASED_OUTPATIENT_CLINIC_OR_DEPARTMENT_OTHER): Payer: Federal, State, Local not specified - PPO | Admitting: Physician Assistant

## 2022-03-26 ENCOUNTER — Inpatient Hospital Stay: Payer: Federal, State, Local not specified - PPO | Attending: Physician Assistant

## 2022-03-26 VITALS — BP 129/79 | HR 72 | Temp 97.9°F | Resp 15 | Wt 252.3 lb

## 2022-03-26 DIAGNOSIS — Z8249 Family history of ischemic heart disease and other diseases of the circulatory system: Secondary | ICD-10-CM | POA: Insufficient documentation

## 2022-03-26 DIAGNOSIS — D500 Iron deficiency anemia secondary to blood loss (chronic): Secondary | ICD-10-CM | POA: Insufficient documentation

## 2022-03-26 DIAGNOSIS — Z79899 Other long term (current) drug therapy: Secondary | ICD-10-CM | POA: Insufficient documentation

## 2022-03-26 DIAGNOSIS — Z833 Family history of diabetes mellitus: Secondary | ICD-10-CM | POA: Diagnosis not present

## 2022-03-26 DIAGNOSIS — N920 Excessive and frequent menstruation with regular cycle: Secondary | ICD-10-CM | POA: Insufficient documentation

## 2022-03-26 DIAGNOSIS — Z8042 Family history of malignant neoplasm of prostate: Secondary | ICD-10-CM | POA: Diagnosis not present

## 2022-03-26 DIAGNOSIS — Z800 Family history of malignant neoplasm of digestive organs: Secondary | ICD-10-CM | POA: Diagnosis not present

## 2022-03-26 DIAGNOSIS — D509 Iron deficiency anemia, unspecified: Secondary | ICD-10-CM

## 2022-03-26 DIAGNOSIS — Z808 Family history of malignant neoplasm of other organs or systems: Secondary | ICD-10-CM | POA: Diagnosis not present

## 2022-03-26 LAB — CBC WITH DIFFERENTIAL (CANCER CENTER ONLY)
Abs Immature Granulocytes: 0.04 10*3/uL (ref 0.00–0.07)
Basophils Absolute: 0.1 10*3/uL (ref 0.0–0.1)
Basophils Relative: 1 %
Eosinophils Absolute: 0 10*3/uL (ref 0.0–0.5)
Eosinophils Relative: 0 %
HCT: 34.4 % — ABNORMAL LOW (ref 36.0–46.0)
Hemoglobin: 10.9 g/dL — ABNORMAL LOW (ref 12.0–15.0)
Immature Granulocytes: 0 %
Lymphocytes Relative: 26 %
Lymphs Abs: 2.5 10*3/uL (ref 0.7–4.0)
MCH: 25.3 pg — ABNORMAL LOW (ref 26.0–34.0)
MCHC: 31.7 g/dL (ref 30.0–36.0)
MCV: 79.8 fL — ABNORMAL LOW (ref 80.0–100.0)
Monocytes Absolute: 0.5 10*3/uL (ref 0.1–1.0)
Monocytes Relative: 5 %
Neutro Abs: 6.6 10*3/uL (ref 1.7–7.7)
Neutrophils Relative %: 68 %
Platelet Count: 333 10*3/uL (ref 150–400)
RBC: 4.31 MIL/uL (ref 3.87–5.11)
RDW: 20 % — ABNORMAL HIGH (ref 11.5–15.5)
WBC Count: 9.8 10*3/uL (ref 4.0–10.5)
nRBC: 0 % (ref 0.0–0.2)

## 2022-03-26 LAB — CMP (CANCER CENTER ONLY)
ALT: 12 U/L (ref 0–44)
AST: 13 U/L — ABNORMAL LOW (ref 15–41)
Albumin: 4.1 g/dL (ref 3.5–5.0)
Alkaline Phosphatase: 74 U/L (ref 38–126)
Anion gap: 5 (ref 5–15)
BUN: 11 mg/dL (ref 6–20)
CO2: 27 mmol/L (ref 22–32)
Calcium: 9.1 mg/dL (ref 8.9–10.3)
Chloride: 105 mmol/L (ref 98–111)
Creatinine: 0.77 mg/dL (ref 0.44–1.00)
GFR, Estimated: >60 mL/min (ref >60)
Glucose, Bld: 104 mg/dL — ABNORMAL HIGH (ref 70–99)
Potassium: 3.6 mmol/L (ref 3.5–5.1)
Sodium: 137 mmol/L (ref 135–145)
Total Bilirubin: 0.3 mg/dL (ref 0.3–1.2)
Total Protein: 7 g/dL (ref 6.5–8.1)

## 2022-03-26 LAB — IRON AND IRON BINDING CAPACITY (CC-WL,HP ONLY)
Iron: 27 ug/dL — ABNORMAL LOW (ref 28–170)
Saturation Ratios: 6 % — ABNORMAL LOW (ref 10.4–31.8)
TIBC: 462 ug/dL — ABNORMAL HIGH (ref 250–450)
UIBC: 435 ug/dL (ref 148–442)

## 2022-03-26 LAB — FERRITIN: Ferritin: <1 ng/mL — ABNORMAL LOW (ref 11–307)

## 2022-03-26 NOTE — Progress Notes (Signed)
[facility] Telephone:([phone]) [phone]   Fax:([phone]  PROGRESS NOTE  Patient Care Team: Billie Ruddy, MD as PCP - General (Family Medicine)  Hematological/Oncological History 1) Labs from OB/GYN: -11/28/2021: WBC 9.0, Hgb 7.3, MCV 62, Plt 495  2) 01/11/2022: Establish care with [facility] with Dr. Narda Rutherford and Dede Query PA-C  3) 01/22/2022-01/30/2022: Received IV feraheme 510 mg x 2 doses  CHIEF COMPLAINTS/PURPOSE OF CONSULTATION:  "Iron deficiency anemia "  HISTORY OF PRESENTING ILLNESS:  Melanie [name] 41 y.o. female returns for a follow up for iron deficiency anemia. She was seen last on 01/11/2022 to establish care. In the interim, she received IV iron infusions.   On exam today, Ms. Kuhlmann reports improvement of energy after receiving IV iron infusions. She is able to complete all her ADLs on her own. She denies any changes to her appetite or weight. She denies nausea, vomiting or abdominal pain. Her bowel habits are unchanged. She reports her menstrual cycle was much heavier this past month with lots of clots. She has decided to undergo hysterectomy and awaiting to finalize surgery date with her OB/GYN.She denies fevers, chills, sweats, shortness of breath, chest pain, cough, headaches, dizziness or syncopal episodes. She has no other complaints. Rest of the 10 point ROS is below.   MEDICAL HISTORY:  Past Medical History:  Diagnosis Date   Fibroid    Hx of varicella    Microcytic anemia    Vaginal Pap smear, abnormal     SURGICAL HISTORY: Past Surgical History:  Procedure Laterality Date   CESAREAN SECTION N/A 11/23/2015   Procedure: Primary CESAREAN SECTION;  Surgeon: Servando Salina, MD;  Location: [facility];  Service: Obstetrics;  Laterality: N/A;  EDD: 12/13/15   LAPAROTOMY N/A 12/04/2013   Procedure: Exploratory Laparotomy with Evacuation Hematoma ;  Surgeon: Marvene Staff, MD;  Location: [facility];  Service: Gynecology;   Laterality: N/A;   MYOMECTOMY N/A 12/03/2013   Procedure: Exploratory Laparotomy MYOMECTOMY;  Surgeon: Marvene Staff, MD;  Location: [facility];  Service: Gynecology;  Laterality: N/A;   RADIOACTIVE SEED GUIDED EXCISIONAL BREAST BIOPSY Right 02/07/2022   Procedure: RADIOACTIVE SEED GUIDED EXCISIONAL RIGHT BREAST BIOPSY;  Surgeon: Rolm Bookbinder, MD;  Location: [facility];  Service: General;  Laterality: Right;    SOCIAL HISTORY: Social History   Socioeconomic History   Marital status: Married    Spouse name: Not on file   Number of children: Not on file   Years of education: Not on file   Highest education level: Not on file  Occupational History   Not on file  Tobacco Use   Smoking status: Never   Smokeless tobacco: Never  Vaping Use   Vaping Use: Never used  Substance and Sexual Activity   Alcohol use: Yes    Comment: socially   Drug use: No   Sexual activity: Yes    Birth control/protection: None  Other Topics Concern   Not on file  Social History Narrative   Not on file   Social Determinants of Health   Financial Resource Strain: Not on file  Food Insecurity: Not on file  Transportation Needs: Not on file  Physical Activity: Not on file  Stress: Not on file  Social Connections: Not on file  Intimate Partner Violence: Not on file    FAMILY HISTORY: Family History  Problem Relation Age of Onset   Cancer Father        prostate   Diabetes  Father    Heart disease Father    Multiple myeloma Father    Cancer Paternal Grandmother        colon   Diabetes Paternal Grandmother     ALLERGIES:  is allergic to latex and nickel.  MEDICATIONS:  Current Outpatient Medications  Medication Sig Dispense Refill   Multiple Vitamin (MULTIVITAMIN) tablet Take 1 tablet by mouth daily.     UNABLE TO FIND Med Name: 2 iron infusions- 2nd one scheduled today (01/30/22) at 1400 (Patient not taking: Reported on 03/26/2022)     No current  facility-administered medications for this visit.   Facility-Administered Medications Ordered in Other Visits  Medication Dose Route Frequency Provider Last Rate Last Admin   triamcinolone acetonide (KENALOG-40) injection 40 mg  40 mg Intramuscular Once Rolm Bookbinder, MD        REVIEW OF SYSTEMS:   Constitutional: ( - ) fevers, ( - )  chills , ( - ) night sweats Eyes: ( - ) blurriness of vision, ( - ) double vision, ( - ) watery eyes Ears, nose, mouth, throat, and face: ( - ) mucositis, ( - ) sore throat Respiratory: ( - ) cough, ( - ) dyspnea, ( - ) wheezes Cardiovascular: ( - ) palpitation, ( - ) chest discomfort, ( - ) lower extremity swelling Gastrointestinal:  ( - ) nausea, ( - ) heartburn, ( - ) change in bowel habits Skin: ( - ) abnormal skin rashes Lymphatics: ( - ) new lymphadenopathy, ( - ) easy bruising Neurological: ( - ) numbness, ( - ) tingling, ( - ) new weaknesses Behavioral/Psych: ( - ) mood change, ( - ) new changes  All other systems were reviewed with the patient and are negative.  PHYSICAL EXAMINATION: ECOG PERFORMANCE STATUS: 1 - Symptomatic but completely ambulatory  Vitals:   03/26/22 1350  BP: 129/79  Pulse: 72  Resp: 15  Temp: 97.9 F (36.6 C)  SpO2: 100%   Filed Weights   03/26/22 1350  Weight: 252 lb 4.8 oz (114.4 kg)    GENERAL: well appearing female in NAD  SKIN: skin color, texture, turgor are normal, no rashes or significant lesions EYES: conjunctiva are pink and non-injected, sclera clear LUNGS: clear to auscultation and percussion with normal breathing effort HEART: regular rate & rhythm and no murmurs and no lower extremity edema Musculoskeletal: no cyanosis of digits and no clubbing  PSYCH: alert & oriented x 3, fluent speech NEURO: no focal motor/sensory deficits  LABORATORY DATA:  I have reviewed the data as listed    Latest Ref Rng & Units 03/26/2022    1:20 PM 02/12/2022    2:17 PM 02/05/2022    1:00 PM  CBC  WBC 4.0 -  10.5 K/uL 9.8  11.6  9.5   Hemoglobin 12.0 - 15.0 g/dL 10.9  11.8  11.1   Hematocrit 36.0 - 46.0 % 34.4  38.9  39.3   Platelets 150 - 400 K/uL 333  455  377        Latest Ref Rng & Units 03/26/2022    1:20 PM 02/12/2022    2:17 PM 01/11/2022    2:49 PM  CMP  Glucose 70 - 99 mg/dL 104  138  80   BUN 6 - 20 mg/dL '11  15  9   ' Creatinine 0.44 - 1.00 mg/dL 0.77  0.80  0.85   Sodium 135 - 145 mmol/L 137  135  137   Potassium 3.5 - 5.1 mmol/L 3.6  3.8  3.9   Chloride 98 - 111 mmol/L 105  101  103   CO2 22 - 32 mmol/L '27  27  28   ' Calcium 8.9 - 10.3 mg/dL 9.1  9.9  9.1   Total Protein 6.5 - 8.1 g/dL 7.0  7.5  7.6   Total Bilirubin 0.3 - 1.2 mg/dL 0.3  0.3  0.3   Alkaline Phos 38 - 126 U/L 74  82  75   AST 15 - 41 U/L '13  13  26   ' ALT 0 - 44 U/L 12  21  39    RADIOGRAPHIC STUDIES: I have personally reviewed the radiological images as listed and agreed with the findings in the report. No results found.  ASSESSMENT & PLAN MODEAN MCCULLUM is a 41 y.o. female returns for a follow up for iron deficiency anemia.  #Iron deficiency anemia 2/2 heavy menstrual bleeding --Received IV feraheme 510 mg once a week x 2 doses --Labs today show improvement of anemia but still deficient. Hgb is 10.9 and MCV 79.8. Iron panel shows ferritin <1, Iron 27, TIBC 462, iron saturation 6%.  --Patient is under the care of OB/GYN with plans to undergo hysterectomy in the near future. --Recommend another round of IV feraheme x 2 doses --RTC in 3 months with labs   No orders of the defined types were placed in this encounter.   All questions were answered. The patient knows to call the clinic with any problems, questions or concerns.  I have spent a total of 30 minutes minutes of face-to-face and non-face-to-face time, preparing to see the patient, performing a medically appropriate examination, counseling and educating the patient, ordering meds, documenting clinical information in the electronic health  record,   and care coordination.   Dede Query, PA-C Department of Hematology/Oncology [place] [facility] Phone: [phone]

## 2022-03-27 ENCOUNTER — Telehealth: Payer: Self-pay | Admitting: Physician Assistant

## 2022-03-27 ENCOUNTER — Telehealth: Payer: Self-pay

## 2022-03-27 NOTE — Telephone Encounter (Signed)
Per 10/17 los called and spoke to pt about appointment

## 2022-03-27 NOTE — Telephone Encounter (Signed)
Pt advised and agreed with plan. 

## 2022-03-27 NOTE — Telephone Encounter (Signed)
-----   Message from Lincoln Brigham, PA-C sent at 03/26/2022 10:29 PM EDT ----- Please notify patient that she needs another round of IV iron.

## 2022-03-28 ENCOUNTER — Telehealth: Payer: Self-pay | Admitting: Pharmacy Technician

## 2022-03-28 NOTE — Telephone Encounter (Signed)
Auth Submission: NO AUTH NEEDED Payer: [employer] [place] Medication & CPT/J Code(s) submitted: Feraheme (ferumoxytol) [id number] Route of submission (phone, fax, portal):  Phone # Fax # Auth type: Buy/Bill Units/visits requested: X2 Reference number: [id number]-H 03/28/22 @ 10:33 Approval from: 03/28/22 to 05/28/22   Patient will be scheduled as soon as possible

## 2022-04-05 ENCOUNTER — Ambulatory Visit: Payer: Federal, State, Local not specified - PPO

## 2022-04-08 ENCOUNTER — Ambulatory Visit (INDEPENDENT_AMBULATORY_CARE_PROVIDER_SITE_OTHER): Payer: Federal, State, Local not specified - PPO

## 2022-04-08 VITALS — BP 126/77 | HR 79 | Temp 98.5°F | Resp 16 | Ht 72.0 in | Wt 252.0 lb

## 2022-04-08 DIAGNOSIS — D500 Iron deficiency anemia secondary to blood loss (chronic): Secondary | ICD-10-CM | POA: Diagnosis not present

## 2022-04-08 MED ORDER — DIPHENHYDRAMINE HCL 25 MG PO CAPS: 25.0000 mg | ORAL_CAPSULE | Freq: Once | ORAL | Status: DC

## 2022-04-08 MED ORDER — ACETAMINOPHEN 325 MG PO TABS: 650.0000 mg | ORAL_TABLET | Freq: Once | ORAL | Status: DC

## 2022-04-08 MED ORDER — SODIUM CHLORIDE 0.9 % IV SOLN
510.0000 mg | Freq: Once | INTRAVENOUS | Status: AC
  Administered 2022-04-08: 510 mg via INTRAVENOUS
  Filled 2022-04-08: qty 17

## 2022-04-08 NOTE — Progress Notes (Signed)
Diagnosis: Iron Deficiency Anemia  Provider:  Marshell Garfinkel MD  Procedure: Infusion  IV Type: Peripheral, IV Location: L Antecubital  Feraheme (Ferumoxytol), Dose: 510 mg  Infusion Start Time: 4403  Infusion Stop Time: 4742  Post Infusion IV Care: Peripheral IV Discontinued  Discharge: Condition: Good, Destination: Home . AVS provided to patient.   Performed by:  Koren Shiver, RN

## 2022-04-13 ENCOUNTER — Telehealth: Payer: Federal, State, Local not specified - PPO | Admitting: Nurse Practitioner

## 2022-04-13 DIAGNOSIS — H1033 Unspecified acute conjunctivitis, bilateral: Secondary | ICD-10-CM

## 2022-04-13 MED ORDER — POLYMYXIN B-TRIMETHOPRIM 10000-0.1 UNIT/ML-% OP SOLN: 1.0000 [drp] | OPHTHALMIC | 0 refills | Status: DC

## 2022-04-13 NOTE — Patient Instructions (Signed)
  Melanie [name], thank you for joining Chevis Pretty, FNP for today's virtual visit.  While this provider is not your primary care provider (PCP), if your PCP is located in our provider database this encounter information will be shared with them immediately following your visit.   [facility] account gives you access to today's visit and all your visits, tests, and labs performed at [facility] " click here if you don't have a [facility] account or go to [email or web address]  Consent: (Patient) Melanie [name] provided verbal consent for this virtual visit at the beginning of the encounter.  Current Medications:  Current Outpatient Medications:    trimethoprim-polymyxin b (POLYTRIM) ophthalmic solution, Place 1 drop into both eyes every 4 (four) hours., Disp: 10 mL, Rfl: 0   Multiple Vitamin (MULTIVITAMIN) tablet, Take 1 tablet by mouth daily., Disp: , Rfl:   Current Facility-Administered Medications:    acetaminophen (TYLENOL) tablet 650 mg, 650 mg, Oral, Once, Tresa Res, RPH   diphenhydrAMINE (BENADRYL) capsule 25 mg, 25 mg, Oral, Once, Tresa Res, RPH  Facility-Administered Medications Ordered in Other Visits:    triamcinolone acetonide (KENALOG-40) injection 40 mg, 40 mg, Intramuscular, Once, Rolm Bookbinder, MD   Medications ordered in this encounter:  Meds ordered this encounter  Medications   trimethoprim-polymyxin b (POLYTRIM) ophthalmic solution    Sig: Place 1 drop into both eyes every 4 (four) hours.    Dispense:  10 mL    Refill:  0    Order Specific Question:   Supervising Provider    Answer:   Chase Picket [[id number]]     *If you need refills on other medications prior to your next appointment, please contact your pharmacy*  Follow-Up: Call back or seek an in-person evaluation if the symptoms worsen or if the condition fails to improve as anticipated.  [facility] [phone]  Other  Instructions Cool compresses Good handwashing Avoid rubbing eye   If you have been instructed to have an in-person evaluation today at a local Urgent Care facility, please use the link below. It will take you to a list of all of our available [facility], including address, phone number and hours of operation. Please do not delay care.  [facility]  If you or a family member do not have a primary care provider, use the link below to schedule a visit and establish care. When you choose a [facility] primary care physician or advanced practice provider, you gain a long-term partner in health. Find a Primary Care Provider  Learn more about [facility] in-office and virtual care options: [facility]

## 2022-04-13 NOTE — Progress Notes (Signed)
Virtual Visit Consent   Melanie [name], you are scheduled for a virtual visit with Mary-Margaret Hassell Done, FNP, a [facility] provider, today.     Just as with appointments in the office, your consent must be obtained to participate.  Your consent will be active for this visit and any virtual visit you may have with one of our providers in the next 365 days.     If you have a MyChart account, a copy of this consent can be sent to you electronically.  All virtual visits are billed to your insurance company just like a traditional visit in the office.    As this is a virtual visit, video technology does not allow for your provider to perform a traditional examination.  This may limit your provider's ability to fully assess your condition.  If your provider identifies any concerns that need to be evaluated in person or the need to arrange testing (such as labs, EKG, etc.), we will make arrangements to do so.     Although advances in technology are sophisticated, we cannot ensure that it will always work on either your end or our end.  If the connection with a video visit is poor, the visit may have to be switched to a telephone visit.  With either a video or telephone visit, we are not always able to ensure that we have a secure connection.     I need to obtain your verbal consent now.   Are you willing to proceed with your visit today? YES   Melanie [name] has provided verbal consent on 04/13/2022 for a virtual visit (video or telephone).   Mary-Margaret Hassell Done, FNP   Date: 04/13/2022 9:24 AM   Virtual Visit via Video Note   I, Mary-Margaret Hassell Done, connected with Melanie [name] ([id number], 01/12/1981) on 04/13/22 at  9:30 AM EDT by a video-enabled telemedicine application and verified that I am speaking with the correct person using two identifiers.  Location: Patient: Virtual Visit Location Patient: Home Provider: Virtual Visit Location Provider: Mobile   I discussed the  limitations of evaluation and management by telemedicine and the availability of in person appointments. The patient expressed understanding and agreed to proceed.    History of Present Illness: Melanie [name] is a 41 y.o. who identifies as a female who was assigned female at birth, and is being seen today for red eye.  HPI: Conjunctivitis  The current episode started 2 days ago. The onset was gradual. The problem occurs continuously. The problem has been gradually worsening (strated in right eye and has spread to left eye). The problem is moderate. Nothing relieves the symptoms. Nothing aggravates the symptoms. Associated symptoms include eye itching, congestion, eye discharge, eye pain and eye redness. Pertinent negatives include no sore throat. The eye pain is mild. Both eyes are affected. The eye pain is not associated with movement.    Review of Systems  HENT:  Positive for congestion. Negative for sore throat.   Eyes:  Positive for pain, discharge, redness and itching.    Problems:  Patient Active Problem List   Diagnosis Date Noted   Iron deficiency anemia due to chronic blood loss 03/26/2022   Microcytic anemia 01/11/2022   Kyphosis of cervical region 04/09/2020   Postpartum care following cesarean delivery (11/23/15) 11/23/2015   S/P myomectomy 12/03/2013    Allergies:  Allergies  Allergen Reactions   Latex Itching    Gets UTI's from latex condoms   Nickel  Skin irritation   Medications:  Current Outpatient Medications:    Multiple Vitamin (MULTIVITAMIN) tablet, Take 1 tablet by mouth daily., Disp: , Rfl:   Current Facility-Administered Medications:    acetaminophen (TYLENOL) tablet 650 mg, 650 mg, Oral, Once, Tresa Res, RPH   diphenhydrAMINE (BENADRYL) capsule 25 mg, 25 mg, Oral, Once, Tresa Res, RPH  Facility-Administered Medications Ordered in Other Visits:    triamcinolone acetonide (KENALOG-40) injection 40 mg, 40 mg, Intramuscular, Once, Rolm Bookbinder, MD  Observations/Objective: Patient is well-developed, well-nourished in no acute distress.  Resting comfortably  at home.  Head is normocephalic, atraumatic.  No labored breathing.  Speech is clear and coherent with logical content.  Patient is alert and oriented at baseline.  Bil scleral injection with yellow exudate in bil inner canthus.  Assessment and Plan:  Melanie [name] in today with chief complaint of Conjunctivitis   1. Acute bacterial conjunctivitis of both eyes Cool compresses Good handwashing Avoid rubbing eye  Meds ordered this encounter  Medications   trimethoprim-polymyxin b (POLYTRIM) ophthalmic solution    Sig: Place 1 drop into both eyes every 4 (four) hours.    Dispense:  10 mL    Refill:  0    Order Specific Question:   Supervising Provider    Answer:   Chase Picket [id number]    Follow Up Instructions: I discussed the assessment and treatment plan with the patient. The patient was provided an opportunity to ask questions and all were answered. The patient agreed with the plan and demonstrated an understanding of the instructions.  A copy of instructions were sent to the patient via MyChart.  The patient was advised to call back or seek an in-person evaluation if the symptoms worsen or if the condition fails to improve as anticipated.  Time:  I spent 7 minutes with the patient via telehealth technology discussing the above problems/concerns.    Mary-Margaret Hassell Done, FNP

## 2022-04-15 ENCOUNTER — Ambulatory Visit (INDEPENDENT_AMBULATORY_CARE_PROVIDER_SITE_OTHER): Payer: Federal, State, Local not specified - PPO

## 2022-04-15 VITALS — BP 128/77 | HR 81 | Temp 98.4°F | Resp 16 | Ht 72.0 in | Wt 251.6 lb

## 2022-04-15 DIAGNOSIS — D500 Iron deficiency anemia secondary to blood loss (chronic): Secondary | ICD-10-CM | POA: Diagnosis not present

## 2022-04-15 MED ORDER — SODIUM CHLORIDE 0.9 % IV SOLN
510.0000 mg | Freq: Once | INTRAVENOUS | Status: AC
  Administered 2022-04-15: 510 mg via INTRAVENOUS
  Filled 2022-04-15: qty 17

## 2022-04-15 NOTE — Progress Notes (Signed)
Diagnosis: Iron Deficiency Anemia  Provider:  Marshell Garfinkel MD  Procedure: Infusion  IV Type: Peripheral, IV Location: L Antecubital  Feraheme (Ferumoxytol), Dose: 510 mg  Infusion Start Time: 9539  Infusion Stop Time: 0950  Post Infusion IV Care: Peripheral IV Discontinued  Discharge: Condition: Good, Destination: Home . AVS provided to patient.   Performed by:  Adelina Mings, LPN

## 2022-06-17 ENCOUNTER — Telehealth: Payer: Self-pay | Admitting: Physician Assistant

## 2022-06-17 NOTE — Telephone Encounter (Signed)
Called patient to r/s 1/19 appointment due to provider PAL. Patient r/s and notified.

## 2022-06-28 ENCOUNTER — Ambulatory Visit: Payer: Federal, State, Local not specified - PPO | Admitting: Physician Assistant

## 2022-06-28 ENCOUNTER — Other Ambulatory Visit: Payer: Federal, State, Local not specified - PPO

## 2022-07-12 ENCOUNTER — Inpatient Hospital Stay: Payer: Federal, State, Local not specified - PPO | Admitting: Physician Assistant

## 2022-07-12 ENCOUNTER — Other Ambulatory Visit: Payer: Self-pay

## 2022-07-12 ENCOUNTER — Inpatient Hospital Stay: Payer: Federal, State, Local not specified - PPO | Attending: Physician Assistant

## 2022-07-12 ENCOUNTER — Other Ambulatory Visit: Payer: Self-pay | Admitting: Physician Assistant

## 2022-07-12 VITALS — BP 136/94 | HR 74 | Temp 98.1°F | Resp 16 | Wt 252.5 lb

## 2022-07-12 DIAGNOSIS — D509 Iron deficiency anemia, unspecified: Secondary | ICD-10-CM | POA: Insufficient documentation

## 2022-07-12 DIAGNOSIS — Z79899 Other long term (current) drug therapy: Secondary | ICD-10-CM | POA: Diagnosis not present

## 2022-07-12 DIAGNOSIS — D500 Iron deficiency anemia secondary to blood loss (chronic): Secondary | ICD-10-CM | POA: Diagnosis not present

## 2022-07-12 LAB — CBC WITH DIFFERENTIAL (CANCER CENTER ONLY)
Abs Immature Granulocytes: 0.03 10*3/uL (ref 0.00–0.07)
Basophils Absolute: 0 10*3/uL (ref 0.0–0.1)
Basophils Relative: 1 %
Eosinophils Absolute: 0.1 10*3/uL (ref 0.0–0.5)
Eosinophils Relative: 1 %
HCT: 29.3 % — ABNORMAL LOW (ref 36.0–46.0)
Hemoglobin: 9.1 g/dL — ABNORMAL LOW (ref 12.0–15.0)
Immature Granulocytes: 0 %
Lymphocytes Relative: 28 %
Lymphs Abs: 2.4 10*3/uL (ref 0.7–4.0)
MCH: 23.5 pg — ABNORMAL LOW (ref 26.0–34.0)
MCHC: 31.1 g/dL (ref 30.0–36.0)
MCV: 75.5 fL — ABNORMAL LOW (ref 80.0–100.0)
Monocytes Absolute: 0.6 10*3/uL (ref 0.1–1.0)
Monocytes Relative: 7 %
Neutro Abs: 5.5 10*3/uL (ref 1.7–7.7)
Neutrophils Relative %: 63 %
Platelet Count: 482 10*3/uL — ABNORMAL HIGH (ref 150–400)
RBC: 3.88 MIL/uL (ref 3.87–5.11)
RDW: 15.9 % — ABNORMAL HIGH (ref 11.5–15.5)
WBC Count: 8.7 10*3/uL (ref 4.0–10.5)
nRBC: 0 % (ref 0.0–0.2)

## 2022-07-12 LAB — CMP (CANCER CENTER ONLY)
ALT: 14 U/L (ref 0–44)
AST: 13 U/L — ABNORMAL LOW (ref 15–41)
Albumin: 4.1 g/dL (ref 3.5–5.0)
Alkaline Phosphatase: 70 U/L (ref 38–126)
Anion gap: 6 (ref 5–15)
BUN: 13 mg/dL (ref 6–20)
CO2: 27 mmol/L (ref 22–32)
Calcium: 9.4 mg/dL (ref 8.9–10.3)
Chloride: 104 mmol/L (ref 98–111)
Creatinine: 0.79 mg/dL (ref 0.44–1.00)
GFR, Estimated: >60 mL/min (ref >60)
Glucose, Bld: 83 mg/dL (ref 70–99)
Potassium: 4 mmol/L (ref 3.5–5.1)
Sodium: 137 mmol/L (ref 135–145)
Total Bilirubin: 0.3 mg/dL (ref 0.3–1.2)
Total Protein: 6.7 g/dL (ref 6.5–8.1)

## 2022-07-12 LAB — IRON AND IRON BINDING CAPACITY (CC-WL,HP ONLY)
Iron: 29 ug/dL (ref 28–170)
Saturation Ratios: 6 % — ABNORMAL LOW (ref 10.4–31.8)
TIBC: 493 ug/dL — ABNORMAL HIGH (ref 250–450)
UIBC: 464 ug/dL — ABNORMAL HIGH (ref 148–442)

## 2022-07-12 LAB — FERRITIN: Ferritin: 2 ng/mL — ABNORMAL LOW (ref 11–307)

## 2022-07-12 MED ORDER — FERROUS SULFATE 325 (65 FE) MG PO TBEC: 325.0000 mg | DELAYED_RELEASE_TABLET | Freq: Every day | ORAL | 3 refills | Status: DC

## 2022-07-12 NOTE — Progress Notes (Signed)
[facility] [phone]   [phone]  PROGRESS NOTE  Patient Care Team: Billie Ruddy, MD as PCP - General (Family Medicine)  Hematological/Oncological History 1) Labs from OB/GYN: -11/28/2021: WBC 9.0, Hgb 7.3, MCV 62, Plt 495  2) 01/11/2022: Establish care with [facility] with Dr. Narda Rutherford and Dede Query PA-C  3) 01/22/2022-01/30/2022: Received IV feraheme 510 mg x 2 doses  4) 04/08/2022-04/15/2022: Received IV feraheme 510 mg x 2 doses  CHIEF COMPLAINTS/PURPOSE OF CONSULTATION:  "Iron deficiency anemia "  HISTORY OF PRESENTING ILLNESS:  Melanie [name] 42 y.o. female returns for a follow up for iron deficiency anemia. She was seen last on 03/26/2022. In the interim, she received IV feraheme infusions x 2 doses.   On exam today, [name] reports having good energy levels and can complete her ADLs on her own.  She is able to exercise as well.  She has a good appetite and denies any dietary changes.  She denies nausea, vomiting or abdominal pain.  Her bowel habits are unchanged without any recurrent episodes of diarrhea or constipation.  She continues to struggle with heavy menstrual cycles and has reached out to her OB/GYN to schedule a hysterectomy.  She has no other signs of bleeding including hematochezia or melena. She denies fevers, chills, sweats, shortness of breath, chest pain, cough, headaches, dizziness or syncopal episodes. She has no other complaints. Rest of the 10 point ROS is below.   MEDICAL HISTORY:  Past Medical History:  Diagnosis Date   Fibroid    Hx of varicella    Microcytic anemia    Vaginal Pap smear, abnormal     SURGICAL HISTORY: Past Surgical History:  Procedure Laterality Date   CESAREAN SECTION N/A 11/23/2015   Procedure: Primary CESAREAN SECTION;  Surgeon: Servando Salina, MD;  Location: [place];  Service: Obstetrics;  Laterality: N/A;  EDD: 12/13/15   LAPAROTOMY N/A 12/04/2013    Procedure: Exploratory Laparotomy with Evacuation Hematoma ;  Surgeon: Marvene Staff, MD;  Location: [facility];  Service: Gynecology;  Laterality: N/A;   MYOMECTOMY N/A 12/03/2013   Procedure: Exploratory Laparotomy MYOMECTOMY;  Surgeon: Marvene Staff, MD;  Location: [facility];  Service: Gynecology;  Laterality: N/A;   RADIOACTIVE SEED GUIDED EXCISIONAL BREAST BIOPSY Right 02/07/2022   Procedure: RADIOACTIVE SEED GUIDED EXCISIONAL RIGHT BREAST BIOPSY;  Surgeon: Rolm Bookbinder, MD;  Location: [place];  Service: General;  Laterality: Right;    SOCIAL HISTORY: Social History   Socioeconomic History   Marital status: Married    Spouse name: Not on file   Number of children: Not on file   Years of education: Not on file   Highest education level: Not on file  Occupational History   Not on file  Tobacco Use   Smoking status: Never   Smokeless tobacco: Never  Vaping Use   Vaping Use: Never used  Substance and Sexual Activity   Alcohol use: Yes    Comment: socially   Drug use: No   Sexual activity: Yes    Birth control/protection: None  Other Topics Concern   Not on file  Social History Narrative   Not on file   Social Determinants of Health   Financial Resource Strain: Not on file  Food Insecurity: Not on file  Transportation Needs: Not on file  Physical Activity: Not on file  Stress: Not on file  Social Connections: Not on file  Intimate Partner Violence: Not on file  FAMILY HISTORY: Family History  Problem Relation Age of Onset   Cancer Father        prostate   Diabetes Father    Heart disease Father    Multiple myeloma Father    Cancer Paternal Grandmother        colon   Diabetes Paternal Grandmother     ALLERGIES:  is allergic to latex and nickel.  MEDICATIONS:  Current Outpatient Medications  Medication Sig Dispense Refill   ferrous sulfate 325 (65 FE) MG EC tablet Take 1 tablet (325 mg total) by mouth daily with breakfast. 30  tablet 3   Multiple Vitamin (MULTIVITAMIN) tablet Take 1 tablet by mouth daily. (Patient not taking: Reported on 07/12/2022)     trimethoprim-polymyxin b (POLYTRIM) ophthalmic solution Place 1 drop into both eyes every 4 (four) hours. (Patient not taking: Reported on 07/12/2022) 10 mL 0   Current Facility-Administered Medications  Medication Dose Route Frequency Provider Last Rate Last Admin   acetaminophen (TYLENOL) tablet 650 mg  650 mg Oral Once Tresa Res, RPH       diphenhydrAMINE (BENADRYL) capsule 25 mg  25 mg Oral Once Tresa Res, [facility]       Facility-Administered Medications Ordered in Other Visits  Medication Dose Route Frequency Provider Last Rate Last Admin   triamcinolone acetonide (KENALOG-40) injection 40 mg  40 mg Intramuscular Once Rolm Bookbinder, MD        REVIEW OF SYSTEMS:   Constitutional: ( - ) fevers, ( - )  chills , ( - ) night sweats Eyes: ( - ) blurriness of vision, ( - ) double vision, ( - ) watery eyes Ears, nose, mouth, throat, and face: ( - ) mucositis, ( - ) sore throat Respiratory: ( - ) cough, ( - ) dyspnea, ( - ) wheezes Cardiovascular: ( - ) palpitation, ( - ) chest discomfort, ( - ) lower extremity swelling Gastrointestinal:  ( - ) nausea, ( - ) heartburn, ( - ) change in bowel habits Skin: ( - ) abnormal skin rashes Lymphatics: ( - ) new lymphadenopathy, ( - ) easy bruising Neurological: ( - ) numbness, ( - ) tingling, ( - ) new weaknesses Behavioral/Psych: ( - ) mood change, ( - ) new changes  All other systems were reviewed with the patient and are negative.  PHYSICAL EXAMINATION: ECOG PERFORMANCE STATUS: 1 - Symptomatic but completely ambulatory  Vitals:   07/12/22 1530  BP: (!) 136/94  Pulse: 74  Resp: 16  Temp: 98.1 F (36.7 C)  SpO2: 100%   Filed Weights   07/12/22 1530  Weight: 252 lb 8 oz (114.5 kg)    GENERAL: well appearing female in NAD  SKIN: skin color, texture, turgor are normal, no rashes or significant lesions EYES:  conjunctiva are pink and non-injected, sclera clear LUNGS: clear to auscultation and percussion with normal breathing effort HEART: regular rate & rhythm and no murmurs and no lower extremity edema Musculoskeletal: no cyanosis of digits and no clubbing  PSYCH: alert & oriented x 3, fluent speech NEURO: no focal motor/sensory deficits  LABORATORY DATA:  I have reviewed the data as listed    Latest Ref Rng & Units 07/12/2022    3:14 PM 03/26/2022    1:20 PM 02/12/2022    2:17 PM  CBC  WBC 4.0 - 10.5 K/uL 8.7  9.8  11.6   Hemoglobin 12.0 - 15.0 g/dL 9.1  10.9  11.8   Hematocrit 36.0 - 46.0 % 29.3  34.4  38.9   Platelets 150 - 400 K/uL 482  333  455        Latest Ref Rng & Units 03/26/2022    1:20 PM 02/12/2022    2:17 PM 01/11/2022    2:49 PM  CMP  Glucose 70 - 99 mg/dL 104  138  80   BUN 6 - 20 mg/dL '11  15  9   '$ Creatinine 0.44 - 1.00 mg/dL 0.77  0.80  0.85   Sodium 135 - 145 mmol/L 137  135  137   Potassium 3.5 - 5.1 mmol/L 3.6  3.8  3.9   Chloride 98 - 111 mmol/L 105  101  103   CO2 22 - 32 mmol/L '27  27  28   '$ Calcium 8.9 - 10.3 mg/dL 9.1  9.9  9.1   Total Protein 6.5 - 8.1 g/dL 7.0  7.5  7.6   Total Bilirubin 0.3 - 1.2 mg/dL 0.3  0.3  0.3   Alkaline Phos 38 - 126 U/L 74  82  75   AST 15 - 41 U/L '13  13  26   '$ ALT 0 - 44 U/L 12  21  39    RADIOGRAPHIC STUDIES: I have personally reviewed the radiological images as listed and agreed with the findings in the report. No results found.  ASSESSMENT & PLAN Melanie [name] is a 42 y.o. female returns for a follow up for iron deficiency anemia.  #Iron deficiency anemia 2/2 heavy menstrual bleeding --Most recently received IV feraheme 510 mg once a week x 2 doses on 04/08/2022 and 04/15/2022 --Patient is under the care of OB/GYN with plans to undergo hysterectomy in the near future. --Labs today show worsening anemia with Hgb 9.1, MCV 75.5, Plt 482K. Iron panel shows saturation 6%, serum iron 29, TIBC 493. Ferritin pending.   --Recommend another round of IV feraheme 510 mg x 2 doses. --Sent prescription for ferrous sulfate 325 mg once daily with a source of vitamin C.  --Need to closely monitor labs ever 4 weeks. --RTC in 12 weeks for a follow up visit.   Orders Placed This Encounter  Procedures   CBC with Differential ([place] Only)    Standing Status:   Standing    Number of Occurrences:   3    Standing Expiration Date:   07/13/2023   Ferritin    Standing Status:   Standing    Number of Occurrences:   3    Standing Expiration Date:   07/13/2023   Iron and Iron Binding Capacity ([facility] only)    Standing Status:   Standing    Number of Occurrences:   3    Standing Expiration Date:   07/13/2023    All questions were answered. The patient knows to call the clinic with any problems, questions or concerns.  I have spent a total of 30 minutes minutes of face-to-face and non-face-to-face time, preparing to see the patient, performing a medically appropriate examination, counseling and educating the patient, ordering meds, documenting clinical information in the electronic health record,   and care coordination.   Dede Query, PA-C Department of Hematology/Oncology [place] at [facility] Phone: [phone]

## 2022-07-15 ENCOUNTER — Telehealth: Payer: Self-pay | Admitting: Physician Assistant

## 2022-07-15 ENCOUNTER — Telehealth: Payer: Self-pay | Admitting: Pharmacy Technician

## 2022-07-15 NOTE — Telephone Encounter (Signed)
Dr. Charlies Silvers, Juluis Rainier note:  Auth Submission: NO AUTH NEEDED Payer: BCBS [employer] Medication & CPT/J Code(s) submitted: Feraheme (ferumoxytol) [phone] Route of submission (phone, fax, portal):  Phone # Fax # Auth type: Buy/Bill Units/visits requested: X2 Reference number:  Approval from: 07/15/22 to 11/13/22 at [place]   Patient will be scheduled as soon as possible.

## 2022-07-15 NOTE — Telephone Encounter (Signed)
Called patient per 2/2 los notes to schedule f/u. Patient scheduled and notified.

## 2022-07-26 ENCOUNTER — Ambulatory Visit (INDEPENDENT_AMBULATORY_CARE_PROVIDER_SITE_OTHER): Payer: Federal, State, Local not specified - PPO | Admitting: *Deleted

## 2022-07-26 VITALS — BP 122/82 | HR 80 | Temp 98.5°F | Resp 18 | Ht 72.0 in | Wt 254.0 lb

## 2022-07-26 DIAGNOSIS — D500 Iron deficiency anemia secondary to blood loss (chronic): Secondary | ICD-10-CM | POA: Diagnosis not present

## 2022-07-26 DIAGNOSIS — N920 Excessive and frequent menstruation with regular cycle: Secondary | ICD-10-CM | POA: Diagnosis not present

## 2022-07-26 MED ORDER — DIPHENHYDRAMINE HCL 25 MG PO CAPS: 25.0000 mg | ORAL_CAPSULE | Freq: Once | ORAL | Status: DC

## 2022-07-26 MED ORDER — ACETAMINOPHEN 325 MG PO TABS: 650.0000 mg | ORAL_TABLET | Freq: Once | ORAL | Status: DC

## 2022-07-26 MED ORDER — SODIUM CHLORIDE 0.9 % IV SOLN
510.0000 mg | Freq: Once | INTRAVENOUS | Status: AC
  Administered 2022-07-26: 510 mg via INTRAVENOUS
  Filled 2022-07-26: qty 17

## 2022-07-26 NOTE — Progress Notes (Signed)
Diagnosis: Iron Deficiency Anemia  Provider:  Marshell Garfinkel MD  Procedure: Infusion  IV Type: Peripheral, IV Location: R Hand  Feraheme (Ferumoxytol), Dose: 510 mg  Infusion Start Time: [id number]  Infusion Stop Time: [id number]  Post Infusion IV Care: Patient declined observation and Peripheral IV Discontinued  Discharge: Condition: Good, Destination: Home . AVS Declined  Performed by:  Baxter Hire, RN

## 2022-08-02 ENCOUNTER — Ambulatory Visit (INDEPENDENT_AMBULATORY_CARE_PROVIDER_SITE_OTHER): Payer: Federal, State, Local not specified - PPO | Admitting: *Deleted

## 2022-08-02 VITALS — BP 130/85 | HR 63 | Temp 98.6°F | Resp 18 | Ht 72.0 in | Wt 251.1 lb

## 2022-08-02 DIAGNOSIS — D500 Iron deficiency anemia secondary to blood loss (chronic): Secondary | ICD-10-CM

## 2022-08-02 DIAGNOSIS — N920 Excessive and frequent menstruation with regular cycle: Secondary | ICD-10-CM | POA: Diagnosis not present

## 2022-08-02 MED ORDER — SODIUM CHLORIDE 0.9 % IV SOLN
510.0000 mg | Freq: Once | INTRAVENOUS | Status: AC
  Administered 2022-08-02: 510 mg via INTRAVENOUS
  Filled 2022-08-02: qty 17

## 2022-08-02 MED ORDER — ACETAMINOPHEN 325 MG PO TABS: 650.0000 mg | ORAL_TABLET | Freq: Once | ORAL | Status: DC

## 2022-08-02 MED ORDER — DIPHENHYDRAMINE HCL 25 MG PO CAPS: 25.0000 mg | ORAL_CAPSULE | Freq: Once | ORAL | Status: DC

## 2022-08-02 NOTE — Progress Notes (Signed)
Diagnosis: Iron Deficiency Anemia  Provider:  Marshell Garfinkel MD  Procedure: Infusion  IV Type: Peripheral, IV Location: R Hand  Feraheme (Ferumoxytol), Dose: 510 mg  Infusion Start Time: [id number] am  Infusion Stop Time: [id number] am  Post Infusion IV Care: Observation period completed and Peripheral IV Discontinued  Discharge: Condition: Good, Destination: Home . AVS Provided and AVS Declined  Performed by:  Oren Beckmann, RN

## 2022-08-09 ENCOUNTER — Inpatient Hospital Stay: Payer: Federal, State, Local not specified - PPO | Attending: Physician Assistant

## 2022-08-09 ENCOUNTER — Other Ambulatory Visit: Payer: Self-pay

## 2022-08-09 DIAGNOSIS — N920 Excessive and frequent menstruation with regular cycle: Secondary | ICD-10-CM | POA: Diagnosis not present

## 2022-08-09 DIAGNOSIS — Z79899 Other long term (current) drug therapy: Secondary | ICD-10-CM | POA: Insufficient documentation

## 2022-08-09 DIAGNOSIS — D500 Iron deficiency anemia secondary to blood loss (chronic): Secondary | ICD-10-CM | POA: Insufficient documentation

## 2022-08-09 LAB — IRON AND IRON BINDING CAPACITY (CC-WL,HP ONLY)
Iron: 58 ug/dL (ref 28–170)
Saturation Ratios: 14 % (ref 10.4–31.8)
TIBC: 405 ug/dL (ref 250–450)
UIBC: 347 ug/dL (ref 148–442)

## 2022-08-09 LAB — CBC WITH DIFFERENTIAL (CANCER CENTER ONLY)
Abs Immature Granulocytes: 0.04 10*3/uL (ref 0.00–0.07)
Basophils Absolute: 0 10*3/uL (ref 0.0–0.1)
Basophils Relative: 0 %
Eosinophils Absolute: 0 10*3/uL (ref 0.0–0.5)
Eosinophils Relative: 0 %
HCT: 35.4 % — ABNORMAL LOW (ref 36.0–46.0)
Hemoglobin: 10.9 g/dL — ABNORMAL LOW (ref 12.0–15.0)
Immature Granulocytes: 0 %
Lymphocytes Relative: 26 %
Lymphs Abs: 2.4 10*3/uL (ref 0.7–4.0)
MCH: 24.2 pg — ABNORMAL LOW (ref 26.0–34.0)
MCHC: 30.8 g/dL (ref 30.0–36.0)
MCV: 78.7 fL — ABNORMAL LOW (ref 80.0–100.0)
Monocytes Absolute: 0.4 10*3/uL (ref 0.1–1.0)
Monocytes Relative: 5 %
Neutro Abs: 6.2 10*3/uL (ref 1.7–7.7)
Neutrophils Relative %: 69 %
Platelet Count: 395 10*3/uL (ref 150–400)
RBC: 4.5 MIL/uL (ref 3.87–5.11)
RDW: 23.6 % — ABNORMAL HIGH (ref 11.5–15.5)
WBC Count: 9.1 10*3/uL (ref 4.0–10.5)
nRBC: 0 % (ref 0.0–0.2)

## 2022-08-09 LAB — FERRITIN: Ferritin: 151 ng/mL (ref 11–307)

## 2022-08-12 ENCOUNTER — Telehealth: Payer: Self-pay

## 2022-08-12 NOTE — Telephone Encounter (Signed)
Pt advised with VU 

## 2022-08-12 NOTE — Telephone Encounter (Signed)
-----   Message from Lincoln Brigham, PA-C sent at 08/12/2022  3:16 PM EST ----- Please notify patient that labs show Hgb and iron levels have improved with IV iron. Recommend to continue on iron pills as prescribed.    ----- Message ----- From: Interface, Lab In [place] Sent: 08/09/2022   2:27 PM EST To: Lincoln Brigham, PA-C

## 2022-08-26 ENCOUNTER — Encounter: Payer: Self-pay | Admitting: Family Medicine

## 2022-08-26 ENCOUNTER — Ambulatory Visit: Payer: Federal, State, Local not specified - PPO | Admitting: Family Medicine

## 2022-08-26 VITALS — BP 120/76 | HR 68 | Temp 98.8°F | Ht 72.0 in | Wt 254.4 lb

## 2022-08-26 DIAGNOSIS — E7841 Elevated Lipoprotein(a): Secondary | ICD-10-CM

## 2022-08-26 DIAGNOSIS — D509 Iron deficiency anemia, unspecified: Secondary | ICD-10-CM

## 2022-08-26 DIAGNOSIS — E538 Deficiency of other specified B group vitamins: Secondary | ICD-10-CM

## 2022-08-26 DIAGNOSIS — R635 Abnormal weight gain: Secondary | ICD-10-CM | POA: Diagnosis not present

## 2022-08-26 DIAGNOSIS — L309 Dermatitis, unspecified: Secondary | ICD-10-CM | POA: Diagnosis not present

## 2022-08-26 DIAGNOSIS — D219 Benign neoplasm of connective and other soft tissue, unspecified: Secondary | ICD-10-CM

## 2022-08-26 DIAGNOSIS — L853 Xerosis cutis: Secondary | ICD-10-CM | POA: Diagnosis not present

## 2022-08-26 DIAGNOSIS — E559 Vitamin D deficiency, unspecified: Secondary | ICD-10-CM

## 2022-08-26 MED ORDER — TRIAMCINOLONE ACETONIDE 0.5 % EX OINT: 1.0000 | TOPICAL_OINTMENT | Freq: Two times a day (BID) | CUTANEOUS | 0 refills | Status: AC

## 2022-08-26 NOTE — Progress Notes (Signed)
Established Patient Office Visit   Subjective  Patient ID: Melanie [name], female    DOB: 1980-11-09  Age: 42 y.o. MRN: [id number]  Chief Complaint  Patient presents with   Rash    Pt c/o ongoing on breast area. Going on at least 6 months.   Pronounced "En-yo-nam"  Pt  is a 42 yo female with pmh sig for Anemia due to chronic blood loss, fibroids who was seen for ongoing concern.  Pt with rash present x 6 months that comes and goes/ never fully resolved, intermittently pruritic.  Seen in clinic 10/01/21, given clotrimazole-betamethasone cream for rash.  Rash started as a dry appearing hyperpigmented area on L breast, then developed on R breast and on abdomen.  Will occasionally have a spot in R axilla.  No lesions on back.  Pt denies changes in soaps, lotions, or detergents.  Using Lubriderm lotion.  Pt followed by Heme Onc for microcytic anemia due to chronic blood loss from fibroids.  Pt taking iron daily.  Has required several iron infusions.  S/p Myomectomy.  Hysterectomy planned.    Pt with h/o exophthalmos and normal TSH.  Concerned eyes are a little more prominent than normal.  Inquires about signs of thyroid dysfunction and rechecking thyroid levels.  Notes changes in weight, dry skin.  Rash      Review of Systems  Skin:  Positive for rash.   Negative unless stated above    Objective:     BP 120/76 (BP Location: Right Arm, Patient Position: Sitting, Cuff Size: Large)   Pulse 68   Temp 98.8 F (37.1 C) (Oral)   Ht 6' (1.829 m)   Wt 254 lb 6.4 oz (115.4 kg)   SpO2 100%   Breastfeeding No   BMI 34.50 kg/m  Wt Readings from Last 3 Encounters:  08/26/22 254 lb 6.4 oz (115.4 kg)  08/02/22 251 lb 1.6 oz (113.9 kg)  07/26/22 254 lb (115.2 kg)      Physical Exam Constitutional:      General: She is not in acute distress.    Appearance: Normal appearance.  HENT:     Head: Normocephalic and atraumatic.     Nose: Nose normal.     Mouth/Throat:     Mouth:  Mucous membranes are moist.  Eyes:     Extraocular Movements: Extraocular movements intact.     Conjunctiva/sclera: Conjunctivae normal.     Pupils: Pupils are equal, round, and reactive to light.     Comments: Bilateral exophthalmos.  Cardiovascular:     Rate and Rhythm: Normal rate.  Pulmonary:     Effort: Pulmonary effort is normal.  Musculoskeletal:     Cervical back: Normal range of motion.  Skin:    General: Skin is warm and dry.     Findings: Lesion and rash present. Rash is macular.     Comments: Dry slightly hyperpigmented skin of L and R breasts.  1 cm hyperpigmented dry appearing macule on L lateral abd/flank.    Neurological:     Mental Status: She is alert and oriented to person, place, and time.      No results found for any visits on 08/26/22.    Assessment & Plan:  Dermatitis -consider eczema vs contact dermatitis, drug rxn. -areas less fungal in appearance and did not fully resolve with use of antifungal cream. -start triamcinolone -for continued symptoms referral to Derm. -     Triamcinolone Acetonide; Apply 1 Application topically 2 (two) times daily  for 7 days.  Dispense: 30 g; Refill: 0 -     T4, free; Future -     TSH; Future  Dry skin -discussed applying a gentle moisturizer daily -drink plenty of water -r/o thyroid dysfunction -     T4, free; Future -     TSH; Future  Weight gain -Body mass index is 34.5 kg/m. -current wt 254.4 lbs -continue lifestyle modifications -obtain labs to r/o vitamin abnormality or thyroid dysfunction. -     Hemoglobin A1c; Future -     Lipid panel; Future -     T4, free; Future -     TSH; Future  Microcytic anemia -hgb 10.9 on 08/09/22 -2/2 chronic blood loss from fibroids. -continue daily iron supplement -continue f/u with Heme/Onc for IV iron -continue f/u with OB/Gyn  Fibroids -s/p myomectomy -hysterectomy planned in the next few months -continue f/u with OB/Gyn  Elevated lipoprotein(a) -LDL 117 on  10/13/20 -     Lipid panel; Future  Vitamin B 12 deficiency -     Vitamin B12; Future  Vitamin D deficiency -     VITAMIN D 25 Hydroxy (Vit-D Deficiency, Fractures); Future   Pt will return to clinic for labs on Friday am when fasting. Return if symptoms worsen or fail to improve.   Billie Ruddy, MD

## 2022-08-26 NOTE — Patient Instructions (Signed)
Melanie [name], [facility] [address] [place], [name] [address] [phone]

## 2022-08-30 ENCOUNTER — Other Ambulatory Visit: Payer: Self-pay | Admitting: Family Medicine

## 2022-08-30 ENCOUNTER — Other Ambulatory Visit: Payer: Federal, State, Local not specified - PPO

## 2022-08-30 ENCOUNTER — Other Ambulatory Visit (INDEPENDENT_AMBULATORY_CARE_PROVIDER_SITE_OTHER): Payer: Federal, State, Local not specified - PPO

## 2022-08-30 DIAGNOSIS — E559 Vitamin D deficiency, unspecified: Secondary | ICD-10-CM | POA: Diagnosis not present

## 2022-08-30 DIAGNOSIS — R635 Abnormal weight gain: Secondary | ICD-10-CM | POA: Diagnosis not present

## 2022-08-30 DIAGNOSIS — E7841 Elevated Lipoprotein(a): Secondary | ICD-10-CM | POA: Diagnosis not present

## 2022-08-30 DIAGNOSIS — E538 Deficiency of other specified B group vitamins: Secondary | ICD-10-CM | POA: Diagnosis not present

## 2022-08-30 DIAGNOSIS — L853 Xerosis cutis: Secondary | ICD-10-CM | POA: Diagnosis not present

## 2022-08-30 DIAGNOSIS — L309 Dermatitis, unspecified: Secondary | ICD-10-CM

## 2022-08-30 LAB — TSH: TSH: 1.96 u[IU]/mL (ref 0.35–5.50)

## 2022-08-30 LAB — LIPID PANEL
Cholesterol: 186 mg/dL (ref 0–200)
HDL: 55.6 mg/dL (ref >39.00)
LDL Cholesterol: 111 mg/dL — ABNORMAL HIGH (ref 0–99)
NonHDL: 130.79
Total CHOL/HDL Ratio: 3
Triglycerides: 98 mg/dL (ref 0.0–149.0)
VLDL: 19.6 mg/dL (ref 0.0–40.0)

## 2022-08-30 LAB — T4, FREE: Free T4: 0.94 ng/dL (ref 0.60–1.60)

## 2022-08-30 LAB — VITAMIN B12: Vitamin B-12: 192 pg/mL — ABNORMAL LOW (ref 211–911)

## 2022-08-30 LAB — HEMOGLOBIN A1C: Hgb A1c MFr Bld: 4.9 % (ref 4.6–6.5)

## 2022-08-30 LAB — VITAMIN D 25 HYDROXY (VIT D DEFICIENCY, FRACTURES): VITD: 9.58 ng/mL — ABNORMAL LOW (ref 30.00–100.00)

## 2022-08-30 MED ORDER — VITAMIN D (ERGOCALCIFEROL) 1.25 MG (50000 UNIT) PO CAPS: 50000.0000 [IU] | ORAL_CAPSULE | ORAL | 0 refills | Status: DC

## 2022-09-13 ENCOUNTER — Inpatient Hospital Stay: Payer: Federal, State, Local not specified - PPO | Attending: Physician Assistant

## 2022-09-13 ENCOUNTER — Other Ambulatory Visit: Payer: Self-pay

## 2022-09-13 DIAGNOSIS — D509 Iron deficiency anemia, unspecified: Secondary | ICD-10-CM | POA: Insufficient documentation

## 2022-09-13 DIAGNOSIS — Z79899 Other long term (current) drug therapy: Secondary | ICD-10-CM | POA: Insufficient documentation

## 2022-09-13 DIAGNOSIS — D500 Iron deficiency anemia secondary to blood loss (chronic): Secondary | ICD-10-CM

## 2022-09-13 LAB — IRON AND IRON BINDING CAPACITY (CC-WL,HP ONLY)
Iron: 54 ug/dL (ref 28–170)
Saturation Ratios: 11 % (ref 10.4–31.8)
TIBC: 472 ug/dL — ABNORMAL HIGH (ref 250–450)
UIBC: 418 ug/dL (ref 148–442)

## 2022-09-13 LAB — CBC WITH DIFFERENTIAL (CANCER CENTER ONLY)
Abs Immature Granulocytes: 0.02 10*3/uL (ref 0.00–0.07)
Basophils Absolute: 0.1 10*3/uL (ref 0.0–0.1)
Basophils Relative: 1 %
Eosinophils Absolute: 0 10*3/uL (ref 0.0–0.5)
Eosinophils Relative: 0 %
HCT: 35.5 % — ABNORMAL LOW (ref 36.0–46.0)
Hemoglobin: 11.2 g/dL — ABNORMAL LOW (ref 12.0–15.0)
Immature Granulocytes: 0 %
Lymphocytes Relative: 24 %
Lymphs Abs: 2.4 10*3/uL (ref 0.7–4.0)
MCH: 24.6 pg — ABNORMAL LOW (ref 26.0–34.0)
MCHC: 31.5 g/dL (ref 30.0–36.0)
MCV: 78 fL — ABNORMAL LOW (ref 80.0–100.0)
Monocytes Absolute: 0.6 10*3/uL (ref 0.1–1.0)
Monocytes Relative: 6 %
Neutro Abs: 6.8 10*3/uL (ref 1.7–7.7)
Neutrophils Relative %: 69 %
Platelet Count: 434 10*3/uL — ABNORMAL HIGH (ref 150–400)
RBC: 4.55 MIL/uL (ref 3.87–5.11)
RDW: 20.6 % — ABNORMAL HIGH (ref 11.5–15.5)
WBC Count: 9.9 10*3/uL (ref 4.0–10.5)
nRBC: 0 % (ref 0.0–0.2)

## 2022-09-13 LAB — FERRITIN: Ferritin: 9 ng/mL — ABNORMAL LOW (ref 11–307)

## 2022-09-16 ENCOUNTER — Ambulatory Visit (INDEPENDENT_AMBULATORY_CARE_PROVIDER_SITE_OTHER): Payer: Federal, State, Local not specified - PPO

## 2022-09-16 ENCOUNTER — Telehealth: Payer: Self-pay | Admitting: Pharmacy Technician

## 2022-09-16 ENCOUNTER — Other Ambulatory Visit: Payer: Self-pay | Admitting: Physician Assistant

## 2022-09-16 ENCOUNTER — Telehealth: Payer: Self-pay

## 2022-09-16 DIAGNOSIS — E538 Deficiency of other specified B group vitamins: Secondary | ICD-10-CM

## 2022-09-16 MED ORDER — CYANOCOBALAMIN 1000 MCG/ML IJ SOLN
1000.0000 ug | Freq: Once | INTRAMUSCULAR | Status: AC
  Administered 2022-09-16: 1000 ug via INTRAMUSCULAR

## 2022-09-16 NOTE — Progress Notes (Signed)
Per orders of Dr. Banks, injection of Cyanocobalamin 1000 mcg given by Jerryl Holzhauer L Lenore Moyano. °Patient tolerated injection well.  °

## 2022-09-16 NOTE — Telephone Encounter (Signed)
Melanie [name].   Auth Submission: NO AUTH NEEDED Site of care: Site of care: [facility] Payer: BCBS [place] Medication & CPT/J Code(s) submitted: Feraheme (ferumoxytol) [id number] Route of submission (phone, fax, portal):  Phone # Fax # Auth type: Buy/Bill Units/visits requested: 2 Reference number: [id number] Approval from: 09/16/22 to 01/16/23   Patient will be scheduled as soon as possible

## 2022-09-16 NOTE — Telephone Encounter (Signed)
-----   Message from Briant Cedar, PA-C sent at 09/16/2022 12:13 PM EDT ----- Iron levels are low and require IV iron infusion. I will request at [facility] infusion.    ----- Message ----- From: Leory Plowman, Lab In [place] Sent: 09/13/2022   2:29 PM EDT To: Briant Cedar, PA-C

## 2022-09-16 NOTE — Telephone Encounter (Signed)
Pt advised and agreed to plan 

## 2022-09-17 ENCOUNTER — Ambulatory Visit: Payer: Federal, State, Local not specified - PPO

## 2022-09-20 ENCOUNTER — Ambulatory Visit (INDEPENDENT_AMBULATORY_CARE_PROVIDER_SITE_OTHER): Payer: Federal, State, Local not specified - PPO

## 2022-09-20 VITALS — BP 128/83 | HR 66 | Temp 98.2°F | Resp 18 | Ht 72.0 in | Wt 250.0 lb

## 2022-09-20 DIAGNOSIS — D500 Iron deficiency anemia secondary to blood loss (chronic): Secondary | ICD-10-CM

## 2022-09-20 DIAGNOSIS — N920 Excessive and frequent menstruation with regular cycle: Secondary | ICD-10-CM | POA: Diagnosis not present

## 2022-09-20 MED ORDER — SODIUM CHLORIDE 0.9 % IV SOLN
510.0000 mg | Freq: Once | INTRAVENOUS | Status: AC
  Administered 2022-09-20: 510 mg via INTRAVENOUS
  Filled 2022-09-20: qty 17

## 2022-09-20 NOTE — Progress Notes (Signed)
Diagnosis: Iron Deficiency Anemia  Provider:  Chilton Greathouse MD  Procedure: Infusion  IV Type: Peripheral, IV Location: L Hand  Feraheme (Ferumoxytol), Dose: 200 mg  Infusion Start Time: 1549  Infusion Stop Time: 1610  Post Infusion IV Care: Peripheral IV Discontinued  Discharge: Condition: Good, Destination: Home . AVS Declined  Performed by:  Adriana Mccallum, RN

## 2022-09-27 ENCOUNTER — Ambulatory Visit (INDEPENDENT_AMBULATORY_CARE_PROVIDER_SITE_OTHER): Payer: Federal, State, Local not specified - PPO | Admitting: *Deleted

## 2022-09-27 VITALS — BP 138/81 | HR 80 | Temp 98.5°F | Resp 20 | Ht 72.0 in | Wt 250.0 lb

## 2022-09-27 DIAGNOSIS — N920 Excessive and frequent menstruation with regular cycle: Secondary | ICD-10-CM

## 2022-09-27 DIAGNOSIS — D500 Iron deficiency anemia secondary to blood loss (chronic): Secondary | ICD-10-CM | POA: Diagnosis not present

## 2022-09-27 MED ORDER — SODIUM CHLORIDE 0.9 % IV SOLN
510.0000 mg | Freq: Once | INTRAVENOUS | Status: AC
  Administered 2022-09-27: 510 mg via INTRAVENOUS
  Filled 2022-09-27: qty 17

## 2022-09-27 NOTE — Progress Notes (Signed)
Diagnosis: Iron Deficiency Anemia  Provider:  Chilton Greathouse MD  Procedure: Infusion  IV Type: Peripheral, IV Location: R Hand  Feraheme (Ferumoxytol), Dose: 510 mg  Infusion Start Time: 1556  Infusion Stop Time: 1613  Post Infusion IV Care: Patient declined observation and Peripheral IV Discontinued  Discharge: Condition: Good, Destination: Home . AVS Declined  Performed by:  Evelena Peat, RN

## 2022-10-11 ENCOUNTER — Other Ambulatory Visit: Payer: Self-pay

## 2022-10-11 ENCOUNTER — Inpatient Hospital Stay (HOSPITAL_BASED_OUTPATIENT_CLINIC_OR_DEPARTMENT_OTHER): Payer: Federal, State, Local not specified - PPO | Admitting: Physician Assistant

## 2022-10-11 ENCOUNTER — Inpatient Hospital Stay: Payer: Federal, State, Local not specified - PPO | Attending: Physician Assistant

## 2022-10-11 ENCOUNTER — Other Ambulatory Visit: Payer: Self-pay | Admitting: Physician Assistant

## 2022-10-11 VITALS — BP 132/82 | HR 62 | Temp 97.8°F | Resp 16 | Wt 250.8 lb

## 2022-10-11 DIAGNOSIS — Z8249 Family history of ischemic heart disease and other diseases of the circulatory system: Secondary | ICD-10-CM | POA: Insufficient documentation

## 2022-10-11 DIAGNOSIS — Z800 Family history of malignant neoplasm of digestive organs: Secondary | ICD-10-CM | POA: Insufficient documentation

## 2022-10-11 DIAGNOSIS — Z79899 Other long term (current) drug therapy: Secondary | ICD-10-CM | POA: Diagnosis not present

## 2022-10-11 DIAGNOSIS — D500 Iron deficiency anemia secondary to blood loss (chronic): Secondary | ICD-10-CM

## 2022-10-11 DIAGNOSIS — Z833 Family history of diabetes mellitus: Secondary | ICD-10-CM | POA: Insufficient documentation

## 2022-10-11 DIAGNOSIS — Z8042 Family history of malignant neoplasm of prostate: Secondary | ICD-10-CM | POA: Insufficient documentation

## 2022-10-11 DIAGNOSIS — Z807 Family history of other malignant neoplasms of lymphoid, hematopoietic and related tissues: Secondary | ICD-10-CM | POA: Insufficient documentation

## 2022-10-11 DIAGNOSIS — N920 Excessive and frequent menstruation with regular cycle: Secondary | ICD-10-CM | POA: Diagnosis not present

## 2022-10-11 LAB — CBC WITH DIFFERENTIAL (CANCER CENTER ONLY)
Abs Immature Granulocytes: 0.02 10*3/uL (ref 0.00–0.07)
Basophils Absolute: 0 10*3/uL (ref 0.0–0.1)
Basophils Relative: 1 %
Eosinophils Absolute: 0 10*3/uL (ref 0.0–0.5)
Eosinophils Relative: 0 %
HCT: 41.4 % (ref 36.0–46.0)
Hemoglobin: 13.2 g/dL (ref 12.0–15.0)
Immature Granulocytes: 0 %
Lymphocytes Relative: 25 %
Lymphs Abs: 2.1 10*3/uL (ref 0.7–4.0)
MCH: 26.3 pg (ref 26.0–34.0)
MCHC: 31.9 g/dL (ref 30.0–36.0)
MCV: 82.6 fL (ref 80.0–100.0)
Monocytes Absolute: 0.5 10*3/uL (ref 0.1–1.0)
Monocytes Relative: 6 %
Neutro Abs: 5.8 10*3/uL (ref 1.7–7.7)
Neutrophils Relative %: 68 %
Platelet Count: 367 10*3/uL (ref 150–400)
RBC: 5.01 MIL/uL (ref 3.87–5.11)
RDW: 19.9 % — ABNORMAL HIGH (ref 11.5–15.5)
WBC Count: 8.5 10*3/uL (ref 4.0–10.5)
nRBC: 0 % (ref 0.0–0.2)

## 2022-10-11 LAB — FERRITIN: Ferritin: 132 ng/mL (ref 11–307)

## 2022-10-11 LAB — CMP (CANCER CENTER ONLY)
ALT: 19 U/L (ref 0–44)
AST: 16 U/L (ref 15–41)
Albumin: 4.7 g/dL (ref 3.5–5.0)
Alkaline Phosphatase: 69 U/L (ref 38–126)
Anion gap: 7 (ref 5–15)
BUN: 11 mg/dL (ref 6–20)
CO2: 26 mmol/L (ref 22–32)
Calcium: 9.7 mg/dL (ref 8.9–10.3)
Chloride: 104 mmol/L (ref 98–111)
Creatinine: 0.77 mg/dL (ref 0.44–1.00)
GFR, Estimated: >60 mL/min (ref >60)
Glucose, Bld: 64 mg/dL — ABNORMAL LOW (ref 70–99)
Potassium: 3.8 mmol/L (ref 3.5–5.1)
Sodium: 137 mmol/L (ref 135–145)
Total Bilirubin: 0.4 mg/dL (ref 0.3–1.2)
Total Protein: 7.7 g/dL (ref 6.5–8.1)

## 2022-10-11 LAB — IRON AND IRON BINDING CAPACITY (CC-WL,HP ONLY)
Iron: 85 ug/dL (ref 28–170)
Saturation Ratios: 23 % (ref 10.4–31.8)
TIBC: 371 ug/dL (ref 250–450)
UIBC: 286 ug/dL (ref 148–442)

## 2022-10-13 ENCOUNTER — Encounter: Payer: Self-pay | Admitting: Physician Assistant

## 2022-10-13 NOTE — Progress Notes (Signed)
[facility] [phone]   [phone]  PROGRESS NOTE  Patient Care Team: Deeann Saint, MD as PCP - General (Family Medicine)  Hematological/Oncological History 1) Labs from OB/GYN: -11/28/2021: WBC 9.0, Hgb 7.3, MCV 62, Plt 495  2) 01/11/2022: Establish care with [facility] Hematology with Dr. Jeanie Sewer and Georga Kaufmann PA-C  3) 01/22/2022-01/30/2022: Received IV feraheme 510 mg x 2 doses  4) 04/08/2022-04/15/2022: Received IV feraheme 510 mg x 2 doses  5) 07/26/2022-08/02/2022: Received IV feraheme 510 mg x 2 doses  6) 09/20/2022-09/27/2022: Received IV ferhaeme 510 mg x 2 doses  CHIEF COMPLAINTS/PURPOSE OF CONSULTATION:  "Iron deficiency anemia "  HISTORY OF PRESENTING ILLNESS:  Melanie [name] 42 y.o. female returns for a follow up for iron deficiency anemia. She was seen last on 07/12/2022. In the interim, she received IV feraheme infusions x 4 doses.   On exam today, [name] reports her energy levels have improved with receiving IV iron infusions. She is still having heavy menstrual bleeding but is scheduled to undergo hysterectomy on 11/29/2022. She denies any appetite or weight changes.  She denies nausea, vomiting or abdominal pain.  Her bowel habits are unchanged without any recurrent episodes of diarrhea or constipation.  She denies fevers, chills, sweats, shortness of breath, chest pain, cough, headaches, dizziness or syncopal episodes. She has no other complaints. Rest of the 10 point ROS is below.   MEDICAL HISTORY:  Past Medical History:  Diagnosis Date   Fibroid    Hx of varicella    Microcytic anemia    Vaginal Pap smear, abnormal     SURGICAL HISTORY: Past Surgical History:  Procedure Laterality Date   CESAREAN SECTION N/A 11/23/2015   Procedure: Primary CESAREAN SECTION;  Surgeon: Maxie Better, MD;  Location: [facility];  Service: Obstetrics;  Laterality: N/A;  EDD: 12/13/15   LAPAROTOMY N/A 12/04/2013    Procedure: Exploratory Laparotomy with Evacuation Hematoma ;  Surgeon: Serita Kyle, MD;  Location: [facility];  Service: Gynecology;  Laterality: N/A;   MYOMECTOMY N/A 12/03/2013   Procedure: Exploratory Laparotomy MYOMECTOMY;  Surgeon: Serita Kyle, MD;  Location: [facility];  Service: Gynecology;  Laterality: N/A;   RADIOACTIVE SEED GUIDED EXCISIONAL BREAST BIOPSY Right 02/07/2022   Procedure: RADIOACTIVE SEED GUIDED EXCISIONAL RIGHT BREAST BIOPSY;  Surgeon: Emelia Loron, MD;  Location: [facility];  Service: General;  Laterality: Right;    SOCIAL HISTORY: Social History   Socioeconomic History   Marital status: Married    Spouse name: Not on file   Number of children: Not on file   Years of education: Not on file   Highest education level: Not on file  Occupational History   Not on file  Tobacco Use   Smoking status: Never   Smokeless tobacco: Never  Vaping Use   Vaping Use: Never used  Substance and Sexual Activity   Alcohol use: Yes    Comment: socially   Drug use: No   Sexual activity: Yes    Birth control/protection: None  Other Topics Concern   Not on file  Social History Narrative   Not on file   Social Determinants of Health   Financial Resource Strain: Not on file  Food Insecurity: Not on file  Transportation Needs: Not on file  Physical Activity: Not on file  Stress: Not on file  Social Connections: Not on file  Intimate Partner Violence: Not on file    FAMILY HISTORY: Family History  Problem  Relation Age of Onset   Cancer Father        prostate   Diabetes Father    Heart disease Father    Multiple myeloma Father    Cancer Paternal Grandmother        colon   Diabetes Paternal Grandmother     ALLERGIES:  is allergic to latex and nickel.  MEDICATIONS:  Current Outpatient Medications  Medication Sig Dispense Refill   ferrous sulfate 325 (65 FE) MG EC tablet Take 1 tablet (325 mg total) by mouth daily with breakfast. 30  tablet 3   Multiple Vitamin (MULTIVITAMIN) tablet Take 1 tablet by mouth daily. (Patient not taking: Reported on 07/12/2022)     trimethoprim-polymyxin b (POLYTRIM) ophthalmic solution Place 1 drop into both eyes every 4 (four) hours. (Patient not taking: Reported on 07/12/2022) 10 mL 0   Current Facility-Administered Medications  Medication Dose Route Frequency Provider Last Rate Last Admin   acetaminophen (TYLENOL) tablet 650 mg  650 mg Oral Once Desma Mcgregor, RPH       diphenhydrAMINE (BENADRYL) capsule 25 mg  25 mg Oral Once Desma Mcgregor, [facility]       Facility-Administered Medications Ordered in Other Visits  Medication Dose Route Frequency Provider Last Rate Last Admin   triamcinolone acetonide (KENALOG-40) injection 40 mg  40 mg Intramuscular Once Emelia Loron, MD        REVIEW OF SYSTEMS:   Constitutional: ( - ) fevers, ( - )  chills , ( - ) night sweats Eyes: ( - ) blurriness of vision, ( - ) double vision, ( - ) watery eyes Ears, nose, mouth, throat, and face: ( - ) mucositis, ( - ) sore throat Respiratory: ( - ) cough, ( - ) dyspnea, ( - ) wheezes Cardiovascular: ( - ) palpitation, ( - ) chest discomfort, ( - ) lower extremity swelling Gastrointestinal:  ( - ) nausea, ( - ) heartburn, ( - ) change in bowel habits Skin: ( - ) abnormal skin rashes Lymphatics: ( - ) new lymphadenopathy, ( - ) easy bruising Neurological: ( - ) numbness, ( - ) tingling, ( - ) new weaknesses Behavioral/Psych: ( - ) mood change, ( - ) new changes  All other systems were reviewed with the patient and are negative.  PHYSICAL EXAMINATION: ECOG PERFORMANCE STATUS: 1 - Symptomatic but completely ambulatory  Vitals:   07/12/22 1530  BP: (!) 136/94  Pulse: 74  Resp: 16  Temp: 98.1 F (36.7 C)  SpO2: 100%   Filed Weights   07/12/22 1530  Weight: 252 lb 8 oz (114.5 kg)    GENERAL: well appearing female in NAD  SKIN: skin color, texture, turgor are normal, no rashes or significant lesions EYES:  conjunctiva are pink and non-injected, sclera clear LUNGS: clear to auscultation and percussion with normal breathing effort HEART: regular rate & rhythm and no murmurs and no lower extremity edema Musculoskeletal: no cyanosis of digits and no clubbing  PSYCH: alert & oriented x 3, fluent speech NEURO: no focal motor/sensory deficits  LABORATORY DATA:  I have reviewed the data as listed    Latest Ref Rng & Units 07/12/2022    3:14 PM 03/26/2022    1:20 PM 02/12/2022    2:17 PM  CBC  WBC 4.0 - 10.5 K/uL 8.7  9.8  11.6   Hemoglobin 12.0 - 15.0 g/dL 9.1  19.1  47.8   Hematocrit 36.0 - 46.0 % 29.3  34.4  38.9   Platelets 150 -  400 K/uL 482  333  455        Latest Ref Rng & Units 03/26/2022    1:20 PM 02/12/2022    2:17 PM 01/11/2022    2:49 PM  CMP  Glucose 70 - 99 mg/dL 960  454  80   BUN 6 - 20 mg/dL 11  15  9    Creatinine 0.44 - 1.00 mg/dL 0.98  1.19  1.47   Sodium 135 - 145 mmol/L 137  135  137   Potassium 3.5 - 5.1 mmol/L 3.6  3.8  3.9   Chloride 98 - 111 mmol/L 105  101  103   CO2 22 - 32 mmol/L 27  27  28    Calcium 8.9 - 10.3 mg/dL 9.1  9.9  9.1   Total Protein 6.5 - 8.1 g/dL 7.0  7.5  7.6   Total Bilirubin 0.3 - 1.2 mg/dL 0.3  0.3  0.3   Alkaline Phos 38 - 126 U/L 74  82  75   AST 15 - 41 U/L 13  13  26    ALT 0 - 44 U/L 12  21  39    RADIOGRAPHIC STUDIES: I have personally reviewed the radiological images as listed and agreed with the findings in the report. No results found.  ASSESSMENT & PLAN ANEEKA JEN is a 42 y.o. female returns for a follow up for iron deficiency anemia.  #Iron deficiency anemia 2/2 heavy menstrual bleeding --Most recently received IV feraheme 510 mg once a week x 2 doses on 04/08/2022 and 04/15/2022 --Patient is under the care of OB/GYN with plans to undergo hysterectomy on 11/29/2022 --Recommend another round of IV feraheme 510 mg x 2 doses 09/20/2022-09/27/2022. --Labs from today show anemia has resolved with Hgb 13.2, MCV 82.6. Iron panel  shows iron 85, TIBC 371, saturation 23%, Ferritin 132.  --no need for additional IV iron at this time.  --continue with ferrous sulfate 325 mg once daily with a source of vitamin C.  --Check labs on 11/01/2022 before her surgery and RTC on 01/03/2023 for labs and follow up.  Orders Placed This Encounter  Procedures   CBC with Differential (Cancer Center Only)    Standing Status:   Standing    Number of Occurrences:   3    Standing Expiration Date:   07/13/2023   Ferritin    Standing Status:   Standing    Number of Occurrences:   3    Standing Expiration Date:   07/13/2023   Iron and Iron Binding Capacity ([facility] only)    Standing Status:   Standing    Number of Occurrences:   3    Standing Expiration Date:   07/13/2023    All questions were answered. The patient knows to call the clinic with any problems, questions or concerns.  I have spent a total of 25 minutes minutes of face-to-face and non-face-to-face time, preparing to see the patient, performing a medically appropriate examination, counseling and educating the patient, documenting clinical information in the electronic health record,   and care coordination.   Georga Kaufmann, PA-C Department of Hematology/Oncology [facility] Phone: [phone]

## 2022-10-14 ENCOUNTER — Telehealth: Payer: Self-pay | Admitting: *Deleted

## 2022-10-14 NOTE — Telephone Encounter (Signed)
TCT patient regarding recent lab results. Advised  that her iron levels are normal. No need for additional IV iron at this time.   She voiced understanding.  No questions or concerns.

## 2022-10-14 NOTE — Telephone Encounter (Signed)
-----   Message from Briant Cedar, PA-C sent at 10/14/2022 12:31 PM EDT ----- Please notify patient that iron levels are normal. No need for additional IV iron at this time.    ----- Message ----- From: Interface, Lab In Luis Llorons Torres Sent: 10/11/2022   1:22 PM EDT To: Briant Cedar, PA-C

## 2022-10-15 ENCOUNTER — Ambulatory Visit (INDEPENDENT_AMBULATORY_CARE_PROVIDER_SITE_OTHER): Payer: Federal, State, Local not specified - PPO

## 2022-10-15 DIAGNOSIS — E538 Deficiency of other specified B group vitamins: Secondary | ICD-10-CM | POA: Diagnosis not present

## 2022-10-15 MED ORDER — CYANOCOBALAMIN 1000 MCG/ML IJ SOLN
1000.0000 ug | Freq: Once | INTRAMUSCULAR | Status: AC
Start: 2022-10-15 — End: 2022-10-15
  Administered 2022-10-15: 1000 ug via INTRAMUSCULAR

## 2022-10-15 NOTE — Progress Notes (Signed)
Pt here for monthly B12 injection per Dr Salomon Fick.  B12 given IM and pt tolerated injection well.  Next B12 injection scheduled for next month.

## 2022-11-01 ENCOUNTER — Inpatient Hospital Stay: Payer: Federal, State, Local not specified - PPO

## 2022-11-01 ENCOUNTER — Other Ambulatory Visit: Payer: Self-pay

## 2022-11-01 DIAGNOSIS — D500 Iron deficiency anemia secondary to blood loss (chronic): Secondary | ICD-10-CM | POA: Diagnosis not present

## 2022-11-01 DIAGNOSIS — Z833 Family history of diabetes mellitus: Secondary | ICD-10-CM | POA: Diagnosis not present

## 2022-11-01 DIAGNOSIS — N920 Excessive and frequent menstruation with regular cycle: Secondary | ICD-10-CM | POA: Diagnosis not present

## 2022-11-01 DIAGNOSIS — Z807 Family history of other malignant neoplasms of lymphoid, hematopoietic and related tissues: Secondary | ICD-10-CM | POA: Diagnosis not present

## 2022-11-01 DIAGNOSIS — Z8042 Family history of malignant neoplasm of prostate: Secondary | ICD-10-CM | POA: Diagnosis not present

## 2022-11-01 DIAGNOSIS — Z800 Family history of malignant neoplasm of digestive organs: Secondary | ICD-10-CM | POA: Diagnosis not present

## 2022-11-01 DIAGNOSIS — Z79899 Other long term (current) drug therapy: Secondary | ICD-10-CM | POA: Diagnosis not present

## 2022-11-01 DIAGNOSIS — Z8249 Family history of ischemic heart disease and other diseases of the circulatory system: Secondary | ICD-10-CM | POA: Diagnosis not present

## 2022-11-01 LAB — CBC WITH DIFFERENTIAL (CANCER CENTER ONLY)
Abs Immature Granulocytes: 0.02 10*3/uL (ref 0.00–0.07)
Basophils Absolute: 0.1 10*3/uL (ref 0.0–0.1)
Basophils Relative: 1 %
Eosinophils Absolute: 0 10*3/uL (ref 0.0–0.5)
Eosinophils Relative: 1 %
HCT: 37.3 % (ref 36.0–46.0)
Hemoglobin: 11.9 g/dL — ABNORMAL LOW (ref 12.0–15.0)
Immature Granulocytes: 0 %
Lymphocytes Relative: 29 %
Lymphs Abs: 2.5 10*3/uL (ref 0.7–4.0)
MCH: 26.6 pg (ref 26.0–34.0)
MCHC: 31.9 g/dL (ref 30.0–36.0)
MCV: 83.3 fL (ref 80.0–100.0)
Monocytes Absolute: 0.5 10*3/uL (ref 0.1–1.0)
Monocytes Relative: 6 %
Neutro Abs: 5.7 10*3/uL (ref 1.7–7.7)
Neutrophils Relative %: 63 %
Platelet Count: 393 10*3/uL (ref 150–400)
RBC: 4.48 MIL/uL (ref 3.87–5.11)
RDW: 16 % — ABNORMAL HIGH (ref 11.5–15.5)
WBC Count: 8.9 10*3/uL (ref 4.0–10.5)
nRBC: 0 % (ref 0.0–0.2)

## 2022-11-01 LAB — FERRITIN: Ferritin: 23 ng/mL (ref 11–307)

## 2022-11-01 LAB — IRON AND IRON BINDING CAPACITY (CC-WL,HP ONLY)
Iron: 35 ug/dL (ref 28–170)
Saturation Ratios: 9 % — ABNORMAL LOW (ref 10.4–31.8)
TIBC: 410 ug/dL (ref 250–450)
UIBC: 375 ug/dL (ref 148–442)

## 2022-11-06 ENCOUNTER — Other Ambulatory Visit: Payer: Self-pay | Admitting: Physician Assistant

## 2022-11-06 ENCOUNTER — Telehealth: Payer: Self-pay | Admitting: Pharmacy Technician

## 2022-11-06 ENCOUNTER — Telehealth: Payer: Self-pay

## 2022-11-06 NOTE — Telephone Encounter (Signed)
-----   Message from Briant Cedar, [place] sent at 11/06/2022  6:27 AM EDT ----- Please notify  patient that her iron levels did drop so we will give IV iron x 1 dose to boost levels before her upcoming surgery.    ----- Message ----- From: Leory Plowman, Lab In [place] Sent: 11/01/2022  12:25 PM EDT To: Briant Cedar, PA-C

## 2022-11-06 NOTE — Telephone Encounter (Signed)
Lm for pt with results and recommendations.  Pt advised scheduling will call after insurance approval

## 2022-11-06 NOTE — Telephone Encounter (Signed)
Auth Submission: NO AUTH NEEDED Site of care: Site of care: [facility] Payer: [employer] FEDERAL Medication & CPT/J Code(s) submitted: Feraheme (ferumoxytol) [id number] Route of submission (phone, fax, portal):  Phone # Fax # Auth type: Buy/Bill Units/visits requested: X1 Reference number:  Approval from: 11/06/22 to 03/09/23

## 2022-11-08 ENCOUNTER — Encounter (HOSPITAL_BASED_OUTPATIENT_CLINIC_OR_DEPARTMENT_OTHER): Payer: Self-pay | Admitting: Obstetrics and Gynecology

## 2022-11-08 ENCOUNTER — Other Ambulatory Visit: Payer: Self-pay

## 2022-11-08 DIAGNOSIS — Z01812 Encounter for preprocedural laboratory examination: Secondary | ICD-10-CM | POA: Diagnosis not present

## 2022-11-08 NOTE — Progress Notes (Signed)
Spoke w/ via phone for pre-op interview---Melanie [name] needs dos---- urine pregnancy per anesthesia, surgeon orders pending as of 11/08/2022              [name] results------11/27/2022 [name] appt for cbc, type & screen COVID test -----patient states asymptomatic no test needed Arrive at -------0530 on Friday, 11/29/2022 NPO after MN NO Solid Food.  Clear liquids from MN until---0430 Med rec completed Medications to take morning of surgery -----none Diabetic medication -----n/a Patient instructed no nail polish to be worn day of surgery Patient instructed to bring photo id and insurance card day of surgery Patient aware to have Driver (ride ) / caregiver    for 24 hours after surgery - husband, Melanie [name]  Patient Special Instructions -----Extended / overnight stay instructions given. Pre-Op special Instructions -----Requested orders from Dr. Cherly Hensen on 11/08/22. Patient verbalized understanding of instructions that were given at this phone interview. Patient denies shortness of breath, chest pain, fever, cough at this phone interview.

## 2022-11-08 NOTE — Progress Notes (Signed)
Your procedure is scheduled on Friday, 11/29/2022.  Report to [facility] AT  5:30 AM.   Call this number if you have problems the morning of surgery  [phone].   OUR ADDRESS IS [address].  [facility] ARE LOCATED IN [facility].  PLEASE BRING YOUR INSURANCE CARD AND PHOTO ID DAY OF SURGERY.  ONLY 2 PEOPLE ARE ALLOWED IN  WAITING  ROOM                                      REMEMBER:  DO NOT EAT FOOD, CANDY GUM OR MINTS  AFTER MIDNIGHT THE NIGHT BEFORE YOUR SURGERY . YOU MAY HAVE CLEAR LIQUIDS FROM MIDNIGHT THE NIGHT BEFORE YOUR SURGERY UNTIL  4:30 AM. NO CLEAR LIQUIDS AFTER   4:30 AM DAY OF SURGERY.  YOU MAY  BRUSH YOUR TEETH MORNING OF SURGERY AND RINSE YOUR MOUTH OUT, NO CHEWING GUM CANDY OR MINTS.     CLEAR LIQUID DIET    Allowed      Water                                                                   Coffee and tea, regular and decaf  (NO cream or milk products of any type, may sweeten)                         Carbonated beverages, regular and diet                                    Sports drinks like Gatorade _____________________________________________________________________     TAKE ONLY THESE MEDICATIONS MORNING OF SURGERY: NONE                                        DO NOT WEAR JEWERLY/  METAL/  PIERCINGS (INCLUDING NO PLASTIC PIERCINGS) DO NOT WEAR LOTIONS, POWDERS, PERFUMES OR NAIL POLISH ON YOUR FINGERNAILS. TOENAIL POLISH IS OK TO WEAR. DO NOT SHAVE FOR 48 HOURS PRIOR TO DAY OF SURGERY.  CONTACTS, GLASSES, OR DENTURES MAY NOT BE WORN TO SURGERY.  REMEMBER: NO SMOKING, VAPING ,  DRUGS OR ALCOHOL FOR 24 HOURS BEFORE YOUR SURGERY.                                    [facility] IS NOT RESPONSIBLE  FOR ANY BELONGINGS.                                                                    Marland Kitchen           [facility] - Preparing for Surgery Before surgery,  you can play an important role.  Because skin is not sterile, your  skin needs to be as free of germs as possible.  You can reduce the number of germs on your skin by washing with CHG (chlorahexidine gluconate) soap before surgery.  CHG is an antiseptic cleaner which kills germs and bonds with the skin to continue killing germs even after washing. Please DO NOT use if you have an allergy to CHG or antibacterial soaps.  If your skin becomes reddened/irritated stop using the CHG and inform your nurse when you arrive at Short Stay. Do not shave (including legs and underarms) for at least 48 hours prior to the first CHG shower.  You may shave your face/neck. Please follow these instructions carefully:  1.  Shower with CHG Soap the night before surgery and the  morning of Surgery.  2.  If you choose to wash your hair, wash your hair first as usual with your  normal  shampoo.  3.  After you shampoo, rinse your hair and body thoroughly to remove the  shampoo.                                        4.  Use CHG as you would any other liquid soap.  You can apply chg directly  to the skin and wash , chg soap provided, night before and morning of your surgery.  5.  Apply the CHG Soap to your body ONLY FROM THE NECK DOWN.   Do not use on face/ open                           Wound or open sores. Avoid contact with eyes, ears mouth and genitals (private parts).                       Wash face,  Genitals (private parts) with your normal soap.             6.  Wash thoroughly, paying special attention to the area where your surgery  will be performed.  7.  Thoroughly rinse your body with warm water from the neck down.  8.  DO NOT shower/wash with your normal soap after using and rinsing off  the CHG Soap.             9.  Pat yourself dry with a clean towel.            10.  Wear clean pajamas.            11.  Place clean sheets on your bed the night of your first shower and do not  sleep with pets. Day of Surgery : Do not apply any lotions/ powders the morning of surgery.  Please  wear clean clothes to the hospital/surgery center.  IF YOU HAVE ANY SKIN IRRITATION OR PROBLEMS WITH THE SURGICAL SOAP, PLEASE GET A BAR OF GOLD DIAL SOAP AND SHOWER THE NIGHT BEFORE YOUR SURGERY AND THE MORNING OF YOUR SURGERY. PLEASE LET THE NURSE KNOW MORNING OF YOUR SURGERY IF YOU HAD ANY PROBLEMS WITH THE SURGICAL SOAP.   YOUR SURGEON MAY HAVE REQUESTED EXTENDED RECOVERY TIME AFTER YOUR SURGERY. IT COULD BE A  JUST A FEW HOURS  UP TO AN OVERNIGHT STAY.  YOUR SURGEON SHOULD HAVE DISCUSSED THIS WITH YOU PRIOR TO YOUR SURGERY.  IN THE EVENT YOU NEED TO STAY OVERNIGHT PLEASE REFER TO THE FOLLOWING GUIDELINES. YOU MAY HAVE UP TO 4 VISITORS  MAY VISIT IN THE EXTENDED RECOVERY ROOM UNTIL 800 PM ONLY.  ONE  VISITOR AGE 67 AND OVER MAY SPEND THE NIGHT AND MUST BE IN EXTENDED RECOVERY ROOM NO LATER THAN 800 PM . YOUR DISCHARGE TIME AFTER YOU SPEND THE NIGHT IS 900 AM THE MORNING AFTER YOUR SURGERY. YOU MAY PACK A SMALL OVERNIGHT BAG WITH TOILETRIES FOR YOUR OVERNIGHT STAY IF YOU WISH.  REGARDLESS OF IF YOU STAY OVER NIGHT OR ARE DISCHARGED THE SAME DAY YOU WILL BE REQUIRED TO HAVE A RESPONSIBLE ADULT (18 YRS OLD OR OLDER) STAY WITH YOU FOR AT LEAST THE FIRST 24 HOURS  YOUR PRESCRIPTION MEDICATIONS WILL BE PROVIDED DURING YOUR HOSPITAL STAY.  ________________________________________________________________________                                                        QUESTIONS Melanie [name] PRE OP NURSE PHONE [phone].

## 2022-11-12 DIAGNOSIS — D251 Intramural leiomyoma of uterus: Secondary | ICD-10-CM | POA: Diagnosis not present

## 2022-11-12 DIAGNOSIS — N898 Other specified noninflammatory disorders of vagina: Secondary | ICD-10-CM | POA: Diagnosis not present

## 2022-11-12 DIAGNOSIS — N920 Excessive and frequent menstruation with regular cycle: Secondary | ICD-10-CM | POA: Diagnosis not present

## 2022-11-12 DIAGNOSIS — N870 Mild cervical dysplasia: Secondary | ICD-10-CM | POA: Diagnosis not present

## 2022-11-15 ENCOUNTER — Ambulatory Visit (HOSPITAL_BASED_OUTPATIENT_CLINIC_OR_DEPARTMENT_OTHER): Admit: 2022-11-15 | Payer: Federal, State, Local not specified - PPO | Admitting: Obstetrics and Gynecology

## 2022-11-15 ENCOUNTER — Encounter (HOSPITAL_BASED_OUTPATIENT_CLINIC_OR_DEPARTMENT_OTHER): Payer: Self-pay

## 2022-11-15 SURGERY — XI ROBOTIC ASSISTED LAPAROSCOPIC HYSTERECTOMY AND SALPINGECTOMY
Anesthesia: General

## 2022-11-22 ENCOUNTER — Ambulatory Visit (INDEPENDENT_AMBULATORY_CARE_PROVIDER_SITE_OTHER): Payer: Federal, State, Local not specified - PPO

## 2022-11-22 VITALS — BP 107/62 | HR 85 | Temp 99.0°F | Resp 18 | Ht 72.0 in | Wt 251.8 lb

## 2022-11-22 DIAGNOSIS — N920 Excessive and frequent menstruation with regular cycle: Secondary | ICD-10-CM | POA: Diagnosis not present

## 2022-11-22 DIAGNOSIS — D500 Iron deficiency anemia secondary to blood loss (chronic): Secondary | ICD-10-CM | POA: Diagnosis not present

## 2022-11-22 MED ORDER — SODIUM CHLORIDE 0.9 % IV SOLN
510.0000 mg | Freq: Once | INTRAVENOUS | Status: AC
  Administered 2022-11-22: 510 mg via INTRAVENOUS
  Filled 2022-11-22: qty 17

## 2022-11-22 MED ORDER — ACETAMINOPHEN 325 MG PO TABS: 650.0000 mg | ORAL_TABLET | Freq: Once | ORAL | Status: DC

## 2022-11-22 MED ORDER — DIPHENHYDRAMINE HCL 25 MG PO CAPS: 25.0000 mg | ORAL_CAPSULE | Freq: Once | ORAL | Status: DC

## 2022-11-22 NOTE — Progress Notes (Signed)
Diagnosis: Iron Deficiency Anemia  Provider:  Chilton Greathouse MD  Procedure: IV Infusion  IV Type: Peripheral, IV Location: R wrist  Feraheme (Ferumoxytol), Dose: 510 mg  Infusion Start Time: 1430  Infusion Stop Time: 1450  Post Infusion IV Care: Patient declined observation and Peripheral IV Discontinued  Discharge: Condition: Stable, Destination: Home . AVS Declined  Performed by:  Wyvonne Lenz, RN

## 2022-11-26 DIAGNOSIS — N840 Polyp of corpus uteri: Secondary | ICD-10-CM | POA: Diagnosis not present

## 2022-11-26 DIAGNOSIS — N920 Excessive and frequent menstruation with regular cycle: Secondary | ICD-10-CM | POA: Diagnosis not present

## 2022-11-27 ENCOUNTER — Encounter (HOSPITAL_COMMUNITY)
Admission: RE | Admit: 2022-11-27 | Discharge: 2022-11-27 | Disposition: A | Discharge status: Home / Self Care | Payer: Federal, State, Local not specified - PPO | Source: Ambulatory Visit | Attending: Obstetrics and Gynecology | Admitting: Obstetrics and Gynecology

## 2022-11-27 DIAGNOSIS — Z01812 Encounter for preprocedural laboratory examination: Secondary | ICD-10-CM | POA: Diagnosis not present

## 2022-11-27 DIAGNOSIS — Z01818 Encounter for other preprocedural examination: Secondary | ICD-10-CM

## 2022-11-27 LAB — TYPE AND SCREEN: Antibody Screen: NEGATIVE

## 2022-11-27 LAB — CBC
HCT: 34.2 % — ABNORMAL LOW (ref 36.0–46.0)
Hemoglobin: 10.7 g/dL — ABNORMAL LOW (ref 12.0–15.0)
MCH: 26.4 pg (ref 26.0–34.0)
MCHC: 31.3 g/dL (ref 30.0–36.0)
MCV: 84.4 fL (ref 80.0–100.0)
Platelets: 415 10*3/uL — ABNORMAL HIGH (ref 150–400)
RBC: 4.05 MIL/uL (ref 3.87–5.11)
RDW: 15.9 % — ABNORMAL HIGH (ref 11.5–15.5)
WBC: 8.7 10*3/uL (ref 4.0–10.5)
nRBC: 0 % (ref 0.0–0.2)

## 2022-11-28 ENCOUNTER — Other Ambulatory Visit: Payer: Self-pay | Admitting: Obstetrics and Gynecology

## 2022-11-28 NOTE — Anesthesia Preprocedure Evaluation (Addendum)
Anesthesia Evaluation  Patient identified by MRN, date of birth, ID band Patient awake    Reviewed: Allergy & Precautions, NPO status , Patient's Chart, lab work & pertinent test results  History of Anesthesia Complications Negative for: history of anesthetic complications  Airway Mallampati: II  TM Distance: >3 FB Neck ROM: Full    Dental  (+) Dental Advisory Given, Teeth Intact   Pulmonary neg pulmonary ROS   Pulmonary exam normal        Cardiovascular negative cardio ROS Normal cardiovascular exam     Neuro/Psych negative neurological ROS  negative psych ROS   GI/Hepatic negative GI ROS, Neg liver ROS,,,  Endo/Other   Obesity   Renal/GU negative Renal ROS     Musculoskeletal negative musculoskeletal ROS (+)    Abdominal   Peds  Hematology  (+) Blood dyscrasia, anemia   Anesthesia Other Findings   Reproductive/Obstetrics  Fibroids                              Anesthesia Physical Anesthesia Plan  ASA: 2  Anesthesia Plan: General   Post-op Pain Management: Tylenol PO (pre-op)* and Toradol IV (intra-op)*   Induction: Intravenous  PONV Risk Score and Plan: 3 and Treatment may vary due to age or medical condition, Ondansetron, Dexamethasone, Midazolam and Scopolamine patch - Pre-op  Airway Management Planned: Oral ETT  Additional Equipment: None  Intra-op Plan:   Post-operative Plan: Extubation in OR  Informed Consent: I have reviewed the patients History and Physical, chart, labs and discussed the procedure including the risks, benefits and alternatives for the proposed anesthesia with the patient or authorized representative who has indicated his/her understanding and acceptance.     Dental advisory given  Plan Discussed with: CRNA and Anesthesiologist  Anesthesia Plan Comments:         Anesthesia Quick Evaluation

## 2022-11-29 ENCOUNTER — Other Ambulatory Visit: Payer: Self-pay

## 2022-11-29 ENCOUNTER — Ambulatory Visit (HOSPITAL_BASED_OUTPATIENT_CLINIC_OR_DEPARTMENT_OTHER): Payer: Federal, State, Local not specified - PPO | Admitting: Certified Registered Nurse Anesthetist

## 2022-11-29 ENCOUNTER — Encounter (HOSPITAL_BASED_OUTPATIENT_CLINIC_OR_DEPARTMENT_OTHER): Payer: Self-pay | Admitting: Obstetrics and Gynecology

## 2022-11-29 ENCOUNTER — Encounter (HOSPITAL_BASED_OUTPATIENT_CLINIC_OR_DEPARTMENT_OTHER)
Admission: RE | Disposition: A | Discharge status: Home / Self Care | Payer: Self-pay | Source: Home / Self Care | Attending: Obstetrics and Gynecology

## 2022-11-29 ENCOUNTER — Ambulatory Visit (HOSPITAL_BASED_OUTPATIENT_CLINIC_OR_DEPARTMENT_OTHER)
Admission: RE | Admit: 2022-11-29 | Discharge: 2022-11-29 | Disposition: A | Discharge status: Home / Self Care | Payer: Federal, State, Local not specified - PPO | Attending: Obstetrics and Gynecology | Admitting: Obstetrics and Gynecology

## 2022-11-29 DIAGNOSIS — K660 Peritoneal adhesions (postprocedural) (postinfection): Secondary | ICD-10-CM | POA: Diagnosis not present

## 2022-11-29 DIAGNOSIS — D649 Anemia, unspecified: Secondary | ICD-10-CM | POA: Insufficient documentation

## 2022-11-29 DIAGNOSIS — N840 Polyp of corpus uteri: Secondary | ICD-10-CM | POA: Insufficient documentation

## 2022-11-29 DIAGNOSIS — N920 Excessive and frequent menstruation with regular cycle: Secondary | ICD-10-CM | POA: Diagnosis present

## 2022-11-29 DIAGNOSIS — N888 Other specified noninflammatory disorders of cervix uteri: Secondary | ICD-10-CM | POA: Diagnosis not present

## 2022-11-29 DIAGNOSIS — D259 Leiomyoma of uterus, unspecified: Secondary | ICD-10-CM | POA: Diagnosis not present

## 2022-11-29 DIAGNOSIS — N8003 Adenomyosis of the uterus: Secondary | ICD-10-CM | POA: Diagnosis not present

## 2022-11-29 DIAGNOSIS — D509 Iron deficiency anemia, unspecified: Secondary | ICD-10-CM | POA: Diagnosis not present

## 2022-11-29 DIAGNOSIS — D251 Intramural leiomyoma of uterus: Secondary | ICD-10-CM | POA: Diagnosis not present

## 2022-11-29 DIAGNOSIS — Z01818 Encounter for other preprocedural examination: Secondary | ICD-10-CM

## 2022-11-29 DIAGNOSIS — D759 Disease of blood and blood-forming organs, unspecified: Secondary | ICD-10-CM | POA: Diagnosis not present

## 2022-11-29 DIAGNOSIS — Z90710 Acquired absence of both cervix and uterus: Secondary | ICD-10-CM | POA: Diagnosis present

## 2022-11-29 HISTORY — DX: Anemia, unspecified: D64.9

## 2022-11-29 HISTORY — DX: Presence of spectacles and contact lenses: Z97.3

## 2022-11-29 HISTORY — DX: Vitamin D deficiency, unspecified: E55.9

## 2022-11-29 HISTORY — PX: ROBOTIC ASSISTED LAPAROSCOPIC HYSTERECTOMY AND SALPINGECTOMY: SHX6379

## 2022-11-29 HISTORY — DX: Deficiency of other specified B group vitamins: E53.8

## 2022-11-29 LAB — TYPE AND SCREEN: ABO/RH(D): O POS

## 2022-11-29 LAB — BASIC METABOLIC PANEL
Anion gap: 10 (ref 5–15)
Anion gap: 10 (ref 5–15)
BUN: 8 mg/dL (ref 6–20)
BUN: 8 mg/dL (ref 6–20)
CO2: 25 mmol/L (ref 22–32)
CO2: 25 mmol/L (ref 22–32)
Calcium: 9 mg/dL (ref 8.9–10.3)
Calcium: 9.2 mg/dL (ref 8.9–10.3)
Chloride: 100 mmol/L (ref 98–111)
Chloride: 100 mmol/L (ref 98–111)
Creatinine, Ser: 0.64 mg/dL (ref 0.44–1.00)
Creatinine, Ser: 0.75 mg/dL (ref 0.44–1.00)
GFR, Estimated: >60 mL/min (ref >60)
GFR, Estimated: >60 mL/min (ref >60)
Glucose, Bld: 109 mg/dL — ABNORMAL HIGH (ref 70–99)
Glucose, Bld: 121 mg/dL — ABNORMAL HIGH (ref 70–99)
Potassium: 3 mmol/L — ABNORMAL LOW (ref 3.5–5.1)
Potassium: 3.5 mmol/L (ref 3.5–5.1)
Sodium: 135 mmol/L (ref 135–145)
Sodium: 135 mmol/L (ref 135–145)

## 2022-11-29 LAB — CBC
HCT: 38.3 % (ref 36.0–46.0)
Hemoglobin: 11.9 g/dL — ABNORMAL LOW (ref 12.0–15.0)
MCH: 26.3 pg (ref 26.0–34.0)
MCHC: 31.1 g/dL (ref 30.0–36.0)
MCV: 84.5 fL (ref 80.0–100.0)
Platelets: 417 10*3/uL — ABNORMAL HIGH (ref 150–400)
RBC: 4.53 MIL/uL (ref 3.87–5.11)
RDW: 16.3 % — ABNORMAL HIGH (ref 11.5–15.5)
WBC: 13 10*3/uL — ABNORMAL HIGH (ref 4.0–10.5)
nRBC: 0 % (ref 0.0–0.2)

## 2022-11-29 LAB — POCT PREGNANCY, URINE: Preg Test, Ur: NEGATIVE

## 2022-11-29 SURGERY — XI ROBOTIC ASSISTED LAPAROSCOPIC HYSTERECTOMY AND SALPINGECTOMY
Anesthesia: General | Site: Abdomen

## 2022-11-29 MED ORDER — SCOPOLAMINE 1 MG/3DAYS TD PT72
1.0000 | MEDICATED_PATCH | TRANSDERMAL | Status: DC
  Administered 2022-11-29: 1.5 mg via TRANSDERMAL

## 2022-11-29 MED ORDER — GLYCOPYRROLATE 0.2 MG/ML IJ SOLN
INTRAMUSCULAR | Status: DC | PRN
  Administered 2022-11-29: .2 mg via INTRAVENOUS

## 2022-11-29 MED ORDER — DEXMEDETOMIDINE HCL IN NACL 80 MCG/20ML IV SOLN
INTRAVENOUS | Status: DC | PRN
  Administered 2022-11-29 (×2): 8 ug via INTRAVENOUS

## 2022-11-29 MED ORDER — OXYCODONE HCL 5 MG PO TABS
5.0000 mg | ORAL_TABLET | ORAL | Status: DC | PRN
  Administered 2022-11-29: 5 mg via ORAL

## 2022-11-29 MED ORDER — PROPOFOL 10 MG/ML IV BOLUS
INTRAVENOUS | Status: DC | PRN
  Administered 2022-11-29: 200 mg via INTRAVENOUS

## 2022-11-29 MED ORDER — MIDAZOLAM HCL 2 MG/2ML IJ SOLN
INTRAMUSCULAR | Status: DC | PRN
  Administered 2022-11-29: 2 mg via INTRAVENOUS

## 2022-11-29 MED ORDER — ACETAMINOPHEN 500 MG PO TABS
ORAL_TABLET | ORAL | Status: AC
  Filled 2022-11-29: qty 2

## 2022-11-29 MED ORDER — MIDAZOLAM HCL 2 MG/2ML IJ SOLN
INTRAMUSCULAR | Status: AC
  Filled 2022-11-29: qty 2

## 2022-11-29 MED ORDER — DEXAMETHASONE SODIUM PHOSPHATE 10 MG/ML IJ SOLN
INTRAMUSCULAR | Status: DC | PRN
  Administered 2022-11-29: 10 mg via INTRAVENOUS

## 2022-11-29 MED ORDER — POTASSIUM CHLORIDE CRYS ER 20 MEQ PO TBCR
40.0000 meq | EXTENDED_RELEASE_TABLET | Freq: Two times a day (BID) | ORAL | Status: DC
  Administered 2022-11-29: 40 meq via ORAL
  Filled 2022-11-29: qty 2

## 2022-11-29 MED ORDER — KETOROLAC TROMETHAMINE 30 MG/ML IJ SOLN
INTRAMUSCULAR | Status: DC | PRN
  Administered 2022-11-29: 30 mg via INTRAVENOUS

## 2022-11-29 MED ORDER — CEFAZOLIN SODIUM-DEXTROSE 2-4 GM/100ML-% IV SOLN
2.0000 g | INTRAVENOUS | Status: AC
  Administered 2022-11-29: 2 g via INTRAVENOUS

## 2022-11-29 MED ORDER — OXYCODONE HCL 5 MG/5ML PO SOLN: 5.0000 mg | Freq: Once | ORAL | Status: DC | PRN

## 2022-11-29 MED ORDER — SCOPOLAMINE 1 MG/3DAYS TD PT72
MEDICATED_PATCH | TRANSDERMAL | Status: AC
  Filled 2022-11-29: qty 1

## 2022-11-29 MED ORDER — EPHEDRINE SULFATE-NACL 50-0.9 MG/10ML-% IV SOSY
PREFILLED_SYRINGE | INTRAVENOUS | Status: DC | PRN
  Administered 2022-11-29 (×2): 5 mg via INTRAVENOUS
  Administered 2022-11-29: 10 mg via INTRAVENOUS

## 2022-11-29 MED ORDER — HYDROMORPHONE HCL 1 MG/ML IJ SOLN
INTRAMUSCULAR | Status: DC | PRN
  Administered 2022-11-29 (×2): .5 mg via INTRAVENOUS

## 2022-11-29 MED ORDER — ONDANSETRON HCL 4 MG/2ML IJ SOLN: 4.0000 mg | Freq: Four times a day (QID) | INTRAMUSCULAR | Status: DC | PRN

## 2022-11-29 MED ORDER — HEMOSTATIC AGENTS (NO CHARGE) OPTIME
TOPICAL | Status: DC | PRN
  Administered 2022-11-29: 1

## 2022-11-29 MED ORDER — PHENYLEPHRINE 80 MCG/ML (10ML) SYRINGE FOR IV PUSH (FOR BLOOD PRESSURE SUPPORT)
PREFILLED_SYRINGE | INTRAVENOUS | Status: DC | PRN
  Administered 2022-11-29 (×2): 80 ug via INTRAVENOUS

## 2022-11-29 MED ORDER — FENTANYL CITRATE (PF) 100 MCG/2ML IJ SOLN
25.0000 ug | INTRAMUSCULAR | Status: DC | PRN
  Administered 2022-11-29: 25 ug via INTRAVENOUS

## 2022-11-29 MED ORDER — PANTOPRAZOLE SODIUM 40 MG PO TBEC
40.0000 mg | DELAYED_RELEASE_TABLET | Freq: Every day | ORAL | Status: DC
  Administered 2022-11-29: 40 mg via ORAL

## 2022-11-29 MED ORDER — ROCURONIUM BROMIDE 10 MG/ML (PF) SYRINGE
PREFILLED_SYRINGE | INTRAVENOUS | Status: DC | PRN
  Administered 2022-11-29: 60 mg via INTRAVENOUS
  Administered 2022-11-29: 20 mg via INTRAVENOUS

## 2022-11-29 MED ORDER — SIMETHICONE 80 MG PO CHEW: 80.0000 mg | CHEWABLE_TABLET | Freq: Four times a day (QID) | ORAL | Status: DC | PRN

## 2022-11-29 MED ORDER — HYDROMORPHONE HCL 2 MG/ML IJ SOLN
INTRAMUSCULAR | Status: AC
  Filled 2022-11-29: qty 1

## 2022-11-29 MED ORDER — FENTANYL CITRATE (PF) 250 MCG/5ML IJ SOLN
INTRAMUSCULAR | Status: DC | PRN
  Administered 2022-11-29 (×2): 50 ug via INTRAVENOUS

## 2022-11-29 MED ORDER — TRIAMCINOLONE ACETONIDE 40 MG/ML IJ SUSP
INTRAMUSCULAR | Status: DC | PRN
  Administered 2022-11-29: 12 mL via SURGICAL_CAVITY

## 2022-11-29 MED ORDER — DEXTROSE IN LACTATED RINGERS 5 % IV SOLN: INTRAVENOUS | Status: DC

## 2022-11-29 MED ORDER — LACTATED RINGERS IV SOLN: INTRAVENOUS | Status: DC

## 2022-11-29 MED ORDER — PANTOPRAZOLE SODIUM 40 MG PO TBEC
DELAYED_RELEASE_TABLET | ORAL | Status: AC
  Filled 2022-11-29: qty 1

## 2022-11-29 MED ORDER — FENTANYL CITRATE (PF) 100 MCG/2ML IJ SOLN
INTRAMUSCULAR | Status: AC
  Filled 2022-11-29: qty 2

## 2022-11-29 MED ORDER — 0.9 % SODIUM CHLORIDE (POUR BTL) OPTIME
TOPICAL | Status: DC | PRN
  Administered 2022-11-29: 500 mL

## 2022-11-29 MED ORDER — ONDANSETRON HCL 4 MG PO TABS: 4.0000 mg | ORAL_TABLET | Freq: Four times a day (QID) | ORAL | Status: DC | PRN

## 2022-11-29 MED ORDER — KETOROLAC TROMETHAMINE 30 MG/ML IJ SOLN: 30.0000 mg | Freq: Once | INTRAMUSCULAR | Status: AC

## 2022-11-29 MED ORDER — ACETAMINOPHEN 500 MG PO TABS
1000.0000 mg | ORAL_TABLET | Freq: Once | ORAL | Status: AC
  Administered 2022-11-29: 1000 mg via ORAL

## 2022-11-29 MED ORDER — ACETAMINOPHEN 500 MG PO TABS
1000.0000 mg | ORAL_TABLET | Freq: Four times a day (QID) | ORAL | Status: DC
  Administered 2022-11-29 (×2): 1000 mg via ORAL

## 2022-11-29 MED ORDER — OXYCODONE HCL 5 MG PO TABS
ORAL_TABLET | ORAL | Status: AC
  Filled 2022-11-29: qty 1

## 2022-11-29 MED ORDER — PROMETHAZINE HCL 25 MG/ML IJ SOLN: 6.2500 mg | INTRAMUSCULAR | Status: DC | PRN

## 2022-11-29 MED ORDER — CEFAZOLIN SODIUM-DEXTROSE 2-4 GM/100ML-% IV SOLN
INTRAVENOUS | Status: AC
  Filled 2022-11-29: qty 100

## 2022-11-29 MED ORDER — MENTHOL 3 MG MT LOZG: 1.0000 | LOZENGE | OROMUCOSAL | Status: DC | PRN

## 2022-11-29 MED ORDER — LIDOCAINE 2% (20 MG/ML) 5 ML SYRINGE
INTRAMUSCULAR | Status: DC | PRN
  Administered 2022-11-29: 60 mg via INTRAVENOUS

## 2022-11-29 MED ORDER — POLYETHYLENE GLYCOL 3350 17 G PO PACK: 17.0000 g | PACK | Freq: Every day | ORAL | Status: DC | PRN

## 2022-11-29 MED ORDER — IBUPROFEN 200 MG PO TABS
600.0000 mg | ORAL_TABLET | Freq: Four times a day (QID) | ORAL | Status: DC
  Administered 2022-11-29: 600 mg via ORAL

## 2022-11-29 MED ORDER — POVIDONE-IODINE 10 % EX SWAB: 2.0000 | Freq: Once | CUTANEOUS | Status: DC

## 2022-11-29 MED ORDER — OXYCODONE HCL 5 MG PO TABS: 5.0000 mg | ORAL_TABLET | Freq: Once | ORAL | Status: DC | PRN

## 2022-11-29 MED ORDER — IBUPROFEN 200 MG PO TABS
ORAL_TABLET | ORAL | Status: AC
  Filled 2022-11-29: qty 3

## 2022-11-29 MED ORDER — ONDANSETRON HCL 4 MG/2ML IJ SOLN
INTRAMUSCULAR | Status: DC | PRN
  Administered 2022-11-29: 4 mg via INTRAVENOUS

## 2022-11-29 MED ORDER — SODIUM CHLORIDE 0.9 % IR SOLN
Status: DC | PRN
  Administered 2022-11-29: 400 mL

## 2022-11-29 MED ORDER — OXYCODONE HCL 5 MG PO TABS: 5.0000 mg | ORAL_TABLET | Freq: Four times a day (QID) | ORAL | 0 refills | Status: AC | PRN

## 2022-11-29 MED ORDER — IBUPROFEN 600 MG PO TABS: 600.0000 mg | ORAL_TABLET | Freq: Four times a day (QID) | ORAL | 11 refills | Status: DC

## 2022-11-29 MED ORDER — ONDANSETRON HCL 4 MG PO TABS: 4.0000 mg | ORAL_TABLET | Freq: Three times a day (TID) | ORAL | 0 refills | Status: DC | PRN

## 2022-11-29 MED ORDER — SUGAMMADEX SODIUM 200 MG/2ML IV SOLN
INTRAVENOUS | Status: DC | PRN
  Administered 2022-11-29: 200 mg via INTRAVENOUS

## 2022-11-29 SURGICAL SUPPLY — 69 items
ADH SKN CLS APL DERMABOND .7 (GAUZE/BANDAGES/DRESSINGS) ×1
APL SRG 38 LTWT LNG FL B (MISCELLANEOUS) ×1
APPLICATOR ARISTA FLEXITIP XL (MISCELLANEOUS) ×2 IMPLANT
BARRIER ADHS 3X4 INTERCEED (GAUZE/BANDAGES/DRESSINGS) IMPLANT
BRR ADH 4X3 ABS CNTRL BYND (GAUZE/BANDAGES/DRESSINGS)
CATH FOLEY 3WAY 5CC 16FR (CATHETERS) ×2 IMPLANT
COVER BACK TABLE 60X90IN (DRAPES) ×2 IMPLANT
COVER TIP SHEARS 8 DVNC (MISCELLANEOUS) ×2 IMPLANT
DEFOGGER SCOPE WARMER CLEARIFY (MISCELLANEOUS) ×2 IMPLANT
DERMABOND ADVANCED .7 DNX12 (GAUZE/BANDAGES/DRESSINGS) ×2 IMPLANT
DRAPE ARM DVNC X/XI (DISPOSABLE) ×8 IMPLANT
DRAPE COLUMN DVNC XI (DISPOSABLE) ×2 IMPLANT
DRAPE SURG IRRIG POUCH 19X23 (DRAPES) ×2 IMPLANT
DRAPE UTILITY XL STRL (DRAPES) ×2 IMPLANT
DRIVER NDL MEGA SUTCUT DVNCXI (INSTRUMENTS) ×2 IMPLANT
DRIVER NDLE MEGA SUTCUT DVNCXI (INSTRUMENTS) ×1 IMPLANT
DURAPREP 26ML APPLICATOR (WOUND CARE) ×2 IMPLANT
ELECT REM PT RETURN 9FT ADLT (ELECTROSURGICAL) ×1
ELECTRODE REM PT RTRN 9FT ADLT (ELECTROSURGICAL) ×2 IMPLANT
FORCEPS BPLR LNG DVNC XI (INSTRUMENTS) ×2 IMPLANT
FORCEPS LONG TIP 8 DVNC XI (FORCEP) ×2 IMPLANT
GAUZE 4X4 16PLY ~~LOC~~+RFID DBL (SPONGE) IMPLANT
GLOVE BIOGEL PI IND STRL 7.0 (GLOVE) ×6 IMPLANT
GLOVE ECLIPSE 6.5 STRL STRAW (GLOVE) ×6 IMPLANT
GLOVE SURG SS PI 6.5 STRL IVOR (GLOVE) IMPLANT
HEMOSTAT ARISTA ABSORB 3G PWDR (HEMOSTASIS) ×2 IMPLANT
HOLDER FOLEY CATH W/STRAP (MISCELLANEOUS) IMPLANT
IRRIG SUCT STRYKERFLOW 2 WTIP (MISCELLANEOUS) ×1
IRRIGATION SUCT STRKRFLW 2 WTP (MISCELLANEOUS) ×2 IMPLANT
IV NS 1000ML (IV SOLUTION) ×1
IV NS 1000ML BAXH (IV SOLUTION) IMPLANT
KIT PINK PAD W/HEAD ARE REST (MISCELLANEOUS) ×1
KIT PINK PAD W/HEAD ARM REST (MISCELLANEOUS) ×2 IMPLANT
KIT TURNOVER CYSTO (KITS) ×2 IMPLANT
LEGGING LITHOTOMY PAIR STRL (DRAPES) ×2 IMPLANT
NDL HYPO 18GX1.5 BLUNT FILL (NEEDLE) IMPLANT
NDL INSUFFLATION 14GA 120MM (NEEDLE) ×2 IMPLANT
NEEDLE HYPO 18GX1.5 BLUNT FILL (NEEDLE) ×1 IMPLANT
NEEDLE INSUFFLATION 14GA 120MM (NEEDLE) ×1 IMPLANT
NS IRRIG 500ML POUR BTL (IV SOLUTION) IMPLANT
OBTURATOR OPTICAL STND 8 DVNC (TROCAR) ×1
OBTURATOR OPTICALSTD 8 DVNC (TROCAR) ×2 IMPLANT
OCCLUDER COLPOPNEUMO (BALLOONS) ×2 IMPLANT
PACK ROBOT WH (CUSTOM PROCEDURE TRAY) ×2 IMPLANT
PACK ROBOTIC GOWN (GOWN DISPOSABLE) ×2 IMPLANT
PAD OB MATERNITY 4.3X12.25 (PERSONAL CARE ITEMS) ×2 IMPLANT
PAD PREP 24X48 CUFFED NSTRL (MISCELLANEOUS) ×2 IMPLANT
PROTECTOR NERVE ULNAR (MISCELLANEOUS) ×4 IMPLANT
SCISSORS MNPLR CVD DVNC XI (INSTRUMENTS) ×2 IMPLANT
SEAL UNIV 5-12 XI (MISCELLANEOUS) ×8 IMPLANT
SEALER VESSEL EXT DVNC XI (MISCELLANEOUS) IMPLANT
SET IRRIG Y TYPE TUR BLADDER L (SET/KITS/TRAYS/PACK) IMPLANT
SET TRI-LUMEN FLTR TB AIRSEAL (TUBING) ×2 IMPLANT
SLEEVE SCD COMPRESS KNEE MED (STOCKING) ×2 IMPLANT
SPIKE FLUID TRANSFER (MISCELLANEOUS) ×2 IMPLANT
SUT VIC AB 0 CT1 36 (SUTURE) ×4 IMPLANT
SUT VIC AB 3-0 SH 27 (SUTURE) ×1
SUT VIC AB 3-0 SH 27XBRD (SUTURE) IMPLANT
SUT VIC AB 4-0 PS2 18 (SUTURE) ×4 IMPLANT
SUT VLOC 180 0 9IN GS21 (SUTURE) ×2 IMPLANT
SYR 5ML LL (SYRINGE) IMPLANT
TIP RUMI ORANGE 6.7MMX12CM (TIP) IMPLANT
TIP UTERINE 5.1X6CM LAV DISP (MISCELLANEOUS) IMPLANT
TIP UTERINE 6.7X10CM GRN DISP (MISCELLANEOUS) IMPLANT
TIP UTERINE 6.7X6CM WHT DISP (MISCELLANEOUS) IMPLANT
TIP UTERINE 6.7X8CM BLUE DISP (MISCELLANEOUS) IMPLANT
TOWEL OR 17X24 6PK STRL BLUE (TOWEL DISPOSABLE) ×2 IMPLANT
TROCAR PORT AIRSEAL 8X120 (TROCAR) ×2 IMPLANT
WATER STERILE IRR 1000ML POUR (IV SOLUTION) ×2 IMPLANT

## 2022-11-29 NOTE — Transfer of Care (Signed)
Immediate Anesthesia Transfer of Care Note  Patient: Melanie [name]  Procedure(s) Performed: XI ROBOTIC ASSISTED LAPAROSCOPIC HYSTERECTOMY AND SALPINGECTOMY (Abdomen)  Patient Location: PACU  Anesthesia Type:General  Level of Consciousness: drowsy and patient cooperative  Airway & Oxygen Therapy: Patient Spontanous Breathing  Post-op Assessment: Report given to RN and Post -op Vital signs reviewed and stable  Post vital signs: Reviewed and stable  Last Vitals:  Vitals Value Taken Time  BP 127/75 11/29/22 1017  Temp    Pulse 85 11/29/22 1021  Resp 16 11/29/22 1021  SpO2 96 % 11/29/22 1021  Vitals shown include unvalidated device data.  Last Pain:  Vitals:   11/29/22 0557  TempSrc: Oral  PainSc: 0-No pain      Patients Stated Pain Goal: 4 (11/29/22 0557)  Complications: No notable events documented.

## 2022-11-29 NOTE — Progress Notes (Signed)
Pt discharged via ambulatory to car w/ husband and this RN.  Pt tolerating po food and fluids w/o c/o nausea.  Up in hallway with staff twice since surgery.  Pain controlled and stating it's a 2 or a 3 on the pain scale.  Voiding large amts of orange urine w/ difficulty.  Dr. Cherly Hensen notified of lab results.

## 2022-11-29 NOTE — H&P (Signed)
Melanie [name] is an 42 y.o. female. G1P1 MBF presents for surgical mgmt of symptomatic uterine fibroids. Hx myomectomy. Ebx benign path. Sono done showed multiple fibroid. Pt declined [place] Pt is scheduled for davinci robotic total hysterectomy, bilateral salpingectomy Pertinent Gynecological History: Menses: flow is excessive with use of 5 pads or tampons on heaviest days Bleeding: menorrhagia Contraception: none DES exposure: denies Blood transfusions: none Sexually transmitted diseases: no past history Previous GYN Procedures:  myomectomy, C/S   Last mammogram: normal Date: 2023 Last pap: abnormal: bx proven CIN 1  Date: 2023 OB History: G1, P1   Menstrual History: Menarche age: n/a Patient's last menstrual period was 10/19/2022 (exact date).    Past Medical History:  Diagnosis Date   Anemia    Patient follows w/ Carlyn Reichert, PA @ [facility]. She is s/p iron infusions.   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 cm   Fibroid    Hx of varicella    Microcytic anemia    Vaginal Pap smear, abnormal    hpv per patient   Vitamin B12 deficiency    Vitamin D deficiency    taking weekly Vitamin D   Wears contact lenses    Wears glasses     Past Surgical History:  Procedure Laterality Date   CESAREAN SECTION N/A 11/23/2015   Procedure: Primary CESAREAN SECTION;  Surgeon: Maxie Better, MD;  Location: [facility];  Service: Obstetrics;  Laterality: N/A;  EDD: 12/13/15   LAPAROTOMY N/A 12/04/2013   Procedure: Exploratory Laparotomy with Evacuation Hematoma ;  Surgeon: Serita Kyle, MD;  Location: [facility];  Service: Gynecology;  Laterality: N/A;   MYOMECTOMY N/A 12/03/2013   Procedure: Exploratory Laparotomy MYOMECTOMY;  Surgeon: Serita Kyle, MD;  Location: [facility];  Service: Gynecology;  Laterality: N/A;   RADIOACTIVE SEED GUIDED EXCISIONAL BREAST BIOPSY Right 02/07/2022   Procedure: RADIOACTIVE SEED GUIDED  EXCISIONAL RIGHT BREAST BIOPSY;  Surgeon: Emelia Loron, MD;  Location:  SURGERY CENTER;  Service: General;  Laterality: Right;    Family History  Problem Relation Age of Onset   Cancer Father        prostate   Diabetes Father    Heart disease Father    Multiple myeloma Father    Cancer Paternal Grandmother        colon   Diabetes Paternal Grandmother     Social History:  reports that she has never smoked. She has never used smokeless tobacco. She reports current alcohol use of about 2.0 standard drinks of alcohol per week. She reports that she does not use drugs.  Allergies:  Allergies  Allergen Reactions   Latex Itching    Gets UTI's from latex condoms   Nickel     Skin irritation    Medications Prior to Admission  Medication Sig Dispense Refill Last Dose   ferrous sulfate 325 (65 FE) MG EC tablet Take 1 tablet (325 mg total) by mouth daily with breakfast. (Patient taking differently: Take 325 mg by mouth every other day. Every other day) 30 tablet 3 11/27/2022   Vitamin D, Ergocalciferol, (DRISDOL) 1.25 MG (50000 UNIT) CAPS capsule Take 1 capsule (50,000 Units total) by mouth every 7 (seven) days. 12 capsule 0 11/23/2022    Review of Systems  All other systems reviewed and are negative.   Blood pressure 139/86, pulse 87, temperature 98.1 F (36.7 C), temperature source Oral, resp. rate 17, height 6' (1.829 m), weight 110.3  kg, last menstrual period 10/19/2022, SpO2 99 %. Physical Exam Constitutional:      Appearance: Normal appearance.  HENT:     Head: Atraumatic.  Eyes:     Extraocular Movements: Extraocular movements intact.  Cardiovascular:     Rate and Rhythm: Regular rhythm.     Heart sounds: Normal heart sounds.  Pulmonary:     Breath sounds: Normal breath sounds.  Abdominal:     Palpations: Abdomen is soft.     Comments: Soft . Pfannenstiel scar  Genitourinary:    General: Normal vulva.     Comments: Vagina nl.  Cervix parous Uterus RF  10 wk irreg Adnexa nl Musculoskeletal:        General: Normal range of motion.     Cervical back: Neck supple.  Skin:    General: Skin is warm and dry.  Neurological:     General: No focal deficit present.     Mental Status: She is alert and oriented to person, place, and time.  Psychiatric:        Mood and Affect: Mood normal.        Behavior: Behavior normal.     Results for orders placed or performed during the hospital encounter of 11/29/22 (from the past 24 hour(s))  Pregnancy, urine POC     Status: None   Collection Time: 11/29/22  5:42 AM  Result Value Ref Range   Preg Test, Ur NEGATIVE NEGATIVE    No results found.  Assessment/Plan: Menorrhagia IDA Fibroid uterus P) Da Vinci robotic total hysterectomy, bilateral salpingectomy. Procedure explained. Risk of surgery reviewed including infection, bleeding, injury to surrounding organ structures, thermal injury , possible need for blood transfusion and its risk, poss need for surgery in the future due to ovarian disease. All? answered  Darel Ricketts A Chantille Navarrete 11/29/2022, 6:55 AM

## 2022-11-29 NOTE — Discharge Instructions (Signed)
Call if temperature greater than equal to 100.4, nothing per vagina for 4-6 weeks or severe nausea vomiting, increased incisional pain , drainage or redness in the incision site, no straining with bowel movements, showers no bath °

## 2022-11-29 NOTE — Brief Op Note (Signed)
11/29/2022  10:15 AM  PATIENT:  Melanie [name]  42 y.o. female  PRE-OPERATIVE DIAGNOSIS:  Menorrhagia with regular cycles , uterine fibroids, IDA  POST-OPERATIVE DIAGNOSIS:  Menorrhagia with regular cycles , uterine fibroids, IDA  PROCEDURE:  DaVinci robotic total hysterectomy, bilateral salpingectomy  SURGEON:  Surgeon(s) and Role:    * Maxie Better, MD - Primary  PHYSICIAN ASSISTANT:   ASSISTANTS: Karmen Stabs RNFA   ANESTHESIA:   general FINDINGS; fibroid uterus, nl tubes. Omental adhesion to anterior abdominal wall, appendix attached to right side wall by adhesion left periovarian adhesions, ureters peristalsis EBL:  25 mL   BLOOD ADMINISTERED:none  DRAINS: none   LOCAL MEDICATIONS USED:  MARCAINE     SPECIMEN:  Source of Specimen:  uterus with cervix, tubes  DISPOSITION OF SPECIMEN:  PATHOLOGY  COUNTS:  YES  TOURNIQUET:  * No tourniquets in log *  DICTATION: .Other Dictation: Dictation Number [id number]  PLAN OF CARE: Admit for overnight observation  PATIENT DISPOSITION:  PACU - hemodynamically stable.   Delay start of Pharmacological VTE agent (>24hrs) due to surgical blood loss or risk of bleeding: no

## 2022-11-29 NOTE — Progress Notes (Signed)
Subjective: Patient reports incisional pain and no problems voiding.   Had vomited once. Getting ready to eat mashed potatoes Reviewed intra-op findings  Objective: I have reviewed patient's vital signs.  vital signs, medications, and labs. Vitals:   11/29/22 1155 11/29/22 1250  BP: 136/80 136/76  Pulse: 68 69  Resp: 16 16  Temp:  98.3 F (36.8 C)  SpO2: 96% 99%   No intake/output data recorded. Total I/O In: 1300 [I.V.:1300] Out: 800 [Urine:775; Blood:25]  Lab Results  Component Value Date   WBC 13.0 (H) 11/29/2022   HGB 11.9 (L) 11/29/2022   HCT 38.3 11/29/2022   MCV 84.5 11/29/2022   PLT 417 (H) 11/29/2022   Lab Results  Component Value Date   CREATININE 0.64 11/29/2022    EXAM General: alert, cooperative, and appears stated age Resp: clear to auscultation bilaterally Cardio: regular rate and rhythm, S1, S2 normal, no murmur, click, rub or gallop GI: incision: clean, dry, and intact and soft non distended (+) BS Extremities: extremities normal, atraumatic, no cyanosis or edema Vaginal Bleeding: minimal Results for orders placed or performed during the hospital encounter of 11/29/22 (from the past 48 hour(s))  Basic metabolic panel     Status: Abnormal   Collection Time: 11/29/22  5:11 PM  Result Value Ref Range   Sodium 135 135 - 145 mmol/L   Potassium 3.5 3.5 - 5.1 mmol/L   Chloride 100 98 - 111 mmol/L   CO2 25 22 - 32 mmol/L   Glucose, Bld 121 (H) 70 - 99 mg/dL    Comment: Glucose reference range applies only to samples taken after fasting for at least 8 hours.   BUN 8 6 - 20 mg/dL   Creatinine, Ser 9.52 0.44 - 1.00 mg/dL   Calcium 9.2 8.9 - 84.1 mg/dL   GFR, Estimated >32 >44 mL/min    Comment: (NOTE) Calculated using the CKD-EPI Creatinine Equation (2021)    Anion gap 10 5 - 15    Comment: Performed at [facility], [address], [place], [place] [address]  CBC     Status: Abnormal   Collection Time: 11/29/22  5:11 PM  Result  Value Ref Range   WBC 13.0 (H) 4.0 - 10.5 K/uL   RBC 4.53 3.87 - 5.11 MIL/uL   Hemoglobin 11.9 (L) 12.0 - 15.0 g/dL   HCT 25.3 66.4 - 40.3 %   MCV 84.5 80.0 - 100.0 fL   MCH 26.3 26.0 - 34.0 pg   MCHC 31.1 30.0 - 36.0 g/dL   RDW 47.4 (H) 25.9 - 56.3 %   Platelets 417 (H) 150 - 400 K/uL   nRBC 0.0 0.0 - 0.2 %    Comment: Performed at [facility], [address] [place], [place] [address]    Assessment: s/p Procedure(s): XI ROBOTIC ASSISTED LAPAROSCOPIC HYSTERECTOMY AND SALPINGECTOMY: stable and progressing well Hypokalemia resolved Plan: Advance diet Encourage ambulation Advance to PO medication Discontinue IV fluids D/c instructions reviewed F/u 2 wk  LOS: 0 days    Serita Kyle, MD 11/29/2022 6:11 PM    11/29/2022, 6:11 PM

## 2022-11-29 NOTE — Anesthesia Procedure Notes (Addendum)
Procedure Name: Intubation Date/Time: 11/29/2022 7:44 AM  Performed by: Dairl Ponder, CRNAPre-anesthesia Checklist: Patient identified, Emergency Drugs available, Suction available and Patient being monitored Patient Re-evaluated:Patient Re-evaluated prior to induction Oxygen Delivery Method: Circle System Utilized Preoxygenation: Pre-oxygenation with 100% oxygen Induction Type: IV induction Ventilation: Mask ventilation without difficulty Laryngoscope Size: Mac and 4 Grade View: Grade III Tube type: Oral Tube size: 7.0 mm Number of attempts: 2 Airway Equipment and Method: Stylet and Oral airway Placement Confirmation: positive ETCO2 and breath sounds checked- equal and bilateral Secured at: 22 cm Tube secured with: Tape Dental Injury: Teeth and Oropharynx as per pre-operative assessment

## 2022-11-29 NOTE — Interval H&P Note (Signed)
History and Physical Interval Note:  11/29/2022 7:34 AM  Melanie [name]  has presented today for surgery, with the diagnosis of Menorrhagia with regular cycles , uterine fibroids.  The various methods of treatment have been discussed with the patient and family. After consideration of risks, benefits and other options for treatment, the patient has consented to  Procedure(s): XI ROBOTIC ASSISTED LAPAROSCOPIC HYSTERECTOMY AND SALPINGECTOMY (N/A) as a surgical intervention.  The patient's history has been reviewed, patient examined, no change in status, stable for surgery.  I have reviewed the patient's chart and labs.  Questions were answered to the patient's satisfaction.     Mishka Stegemann A Burney Calzadilla

## 2022-11-29 NOTE — Anesthesia Postprocedure Evaluation (Signed)
Anesthesia Post Note  Patient: Melanie [name]  Procedure(s) Performed: XI ROBOTIC ASSISTED LAPAROSCOPIC HYSTERECTOMY AND SALPINGECTOMY (Abdomen)     Patient location during evaluation: PACU Anesthesia Type: General Level of consciousness: awake and alert Pain management: pain level controlled Vital Signs Assessment: post-procedure vital signs reviewed and stable Respiratory status: spontaneous breathing, nonlabored ventilation and respiratory function stable Cardiovascular status: stable and blood pressure returned to baseline Anesthetic complications: no   No notable events documented.  Last Vitals:  Vitals:   11/29/22 1155 11/29/22 1250  BP: 136/80 136/76  Pulse: 68 69  Resp: 16 16  Temp:  36.8 C  SpO2: 96% 99%    Last Pain:  Vitals:   11/29/22 1341  TempSrc:   PainSc: 2                  Melanie [name]

## 2022-11-30 NOTE — Op Note (Unsigned)
NAME: Melanie [name], MARIO MEDICAL RECORD NO: [id number] ACCOUNT NO: [id number] DATE OF BIRTH: 1981-01-27 FACILITY: [facility] LOCATION: [facility] PHYSICIAN: Romualdo Prosise A. Cherly Hensen, MD  Operative Report   DATE OF PROCEDURE: 11/29/2022  PREOPERATIVE DIAGNOSES:  Menorrhagia with regular cycles, fibroid uterus.  PROCEDURE:  A da Vinci robotic total hysterectomy, bilateral salpingectomy, lysis of adhesions.  POSTOPERATIVE DIAGNOSES:  Menorrhagia, fibroid uterus, abdominal wall adhesions.  ANESTHESIA:  General.  SURGEON:  Maxie Better, MD  ASSISTANT:  Karmen Stabs, RNFA.  DESCRIPTION OF PROCEDURE:  Under adequate general anesthesia, the patient was placed in the dorsal lithotomy position.  She was positioned for robotic surgery. The patient was sterilely prepped and draped in the usual fashion.  A 3-way Foley catheter was  sterilely placed.  Weighted speculum was placed in the vagina.  Sims retractor was placed anteriorly. The cervix was parous. 0 Vicryl figure-of-eight suture was placed on the anterior and posterior lip of the cervix.  Uterus sounded to 11 cm.  A #10 mm  uterine manipulator was introduced into the uterine cavity along with a medium size KOH Rumi cup.  All the retractors were then removed.  Attention was then turned to the abdomen. 0.25% Marcaine with Kenalog was then injected supraumbilically.  A small  supraumbilical vertical incision was made.  Veress needle was introduced, tested with normal saline. 4 liters of CO2 was insufflated and opening pressure of 7.  Veress needle was then removed, 8 mm robotic trocar was introduced into the abdomen without  incident.  The robotic camera was then inserted, entering the abdomen was without incident.  The findings however were noted of omental adhesion in the anterior abdominal wall in the midline.  Going around the adhesions, the appendix was noted, attached  to the right sidewall with adhesions.  Additional port sites were then  subsequently placed with 0.25% Marcaine with Kenalog then injected.  On the left side, left lateral distal small incision was made, 8 mm robotic port was inserted under direct  visualization.  In between that robotic camera port site and the lateral wall, left side, an 8 mm AirSeal was then inserted.  It was also placed under direct visualization.  On the right, two robotic port sites were placed, 9 cm apart.  It was also  placed under direct visualization after injecting with local anesthesia. The patient was then placed in deep Trendelenburg position.  It was noted that there was adhesion from the bladder, adhesions was noted from her previous cesarean section.  Fibroid  uterus was noted. Some adhesions on the left side.  Both adnexa could be seen. No lesions in the posterior cul-de-sac.  The robot was then docked and in arm #1, a vessel sealer was placed, in arm 4 was the monopolar scissors, arm 3 was the long bipolar  grasper.  I then went to the surgical console.  At the surgical console, after again inspection, the omental adhesion to the anterior abdominal wall was carefully lysed until it was removed completely.  Attention was then turned to the pelvis.  Both  ureters were seen peristalsing well.  The left fallopian tube was grasped. The underlying mesosalpinx was serially clamped, cauterized and cut.  Using the vessel sealer, the tube was removed.  The left retroperitoneal space was then opened and dissection  was then occurred opening, was placed on the posterior leaf of the broad ligament and the left uteroovarian ligament was serially clamped, cauterized and cut.  The posterior leaf of the broad ligament was further  sharply dissected, displaced inferiorly.   The left round ligament was clamped, cauterized and cut.  The anterior leaf of the broad ligament was then opened and carried across the bladder peritoneum with the bladder gently dissected off the lower uterine segment and displaced  inferiorly.   Uterine vessels were carefully skeletonized on the left, there were then serially clamped, cauterized and cut using the vessel sealer.  Attention was then turned to the opposite side.  With the initial cut of the uterine vessels on the left, bleeding was  noted.  The round bipolar grasper was then used to cauterized achieving hemostasis.  The same procedure was performed on the contralateral side after removal of the right tube.  The vesicouterine peritoneum was further dissected inferiorly and uterine  vessels on the right was serially clamped, cauterized and also cut. Anteriorly and posteriorly overlying the Rumi cup, the cervicovaginal junction was opened anteriorly and posteriorly and then the uterine vessels bilaterally were then isolated, clamped,  cauterized and cut.  This detached the uterus from the vagina.  Uterus was then removed through the vagina.  The vaginal cuff had small bleeders, were cauterized.  The vaginal insufflator which had been inserted, was replaced back in the vagina and the  uterus was removed. The instruments were then switched out with a vessel sealer becoming the long tip forceps and the scissors became the mega suture needle driver.  A V-Loc suture was then placed.  The bladder was further displaced inferiorly.  The  vaginal cuff was closed in a 2 layer closure using the V-Loc suture. The vaginal cuff was inspected digitally with good approximation noted.  The pressure was decreased and abdomen was irrigated and suctioned of debris.  Good hemostasis was then noted.   The ureters were noted to be peristalsing well and Arista potato starch was then placed overlying the vaginal cuff.  Once good hemostasis was achieved, the robotic instruments were removed.  The robot was undocked.  I then went back to the patient's  bedside sterilely and the robotic ports were then removed.  The incisions were closed with 4-0 Vicryl sutures and the vagina was inspected digitally  and visually. Small bleeding along the hymenal ring resulted in a simple figure-of-eight suture being  placed with good hemostasis.  SPECIMEN:  Uterus with cervix, fallopian tubes sent to pathology.  ESTIMATED BLOOD LOSS:  25 mL.  INTRAOPERATIVE FLUIDS:  1200 mL.  URINE OUTPUT:  378 mL.  COUNTS:  Sponge and instrument counts x 2 was correct.  COMPLICATIONS:  None.  CONDITION:  The patient tolerated the procedure well and was transferred to recovery in stable condition.   MUK D: 11/29/2022 11:51:06 pm T: 11/30/2022 12:07:00 am  JOB: [id number]/ [id number]

## 2022-12-02 ENCOUNTER — Encounter (HOSPITAL_BASED_OUTPATIENT_CLINIC_OR_DEPARTMENT_OTHER): Payer: Self-pay | Admitting: Obstetrics and Gynecology

## 2022-12-02 LAB — SURGICAL PATHOLOGY

## 2022-12-20 DIAGNOSIS — N644 Mastodynia: Secondary | ICD-10-CM | POA: Diagnosis not present

## 2023-01-02 ENCOUNTER — Other Ambulatory Visit: Payer: Self-pay | Admitting: Physician Assistant

## 2023-01-02 DIAGNOSIS — D500 Iron deficiency anemia secondary to blood loss (chronic): Secondary | ICD-10-CM

## 2023-01-03 ENCOUNTER — Inpatient Hospital Stay: Payer: Federal, State, Local not specified - PPO | Attending: Physician Assistant | Admitting: Physician Assistant

## 2023-01-03 ENCOUNTER — Other Ambulatory Visit: Payer: Self-pay

## 2023-01-03 ENCOUNTER — Inpatient Hospital Stay: Payer: Federal, State, Local not specified - PPO | Attending: Physician Assistant

## 2023-01-03 VITALS — BP 135/70 | HR 64 | Temp 100.4°F | Resp 13 | Wt 245.7 lb

## 2023-01-03 DIAGNOSIS — K5900 Constipation, unspecified: Secondary | ICD-10-CM | POA: Insufficient documentation

## 2023-01-03 DIAGNOSIS — Z8249 Family history of ischemic heart disease and other diseases of the circulatory system: Secondary | ICD-10-CM | POA: Diagnosis not present

## 2023-01-03 DIAGNOSIS — Z90710 Acquired absence of both cervix and uterus: Secondary | ICD-10-CM | POA: Diagnosis not present

## 2023-01-03 DIAGNOSIS — Z833 Family history of diabetes mellitus: Secondary | ICD-10-CM | POA: Insufficient documentation

## 2023-01-03 DIAGNOSIS — D509 Iron deficiency anemia, unspecified: Secondary | ICD-10-CM | POA: Diagnosis not present

## 2023-01-03 DIAGNOSIS — D500 Iron deficiency anemia secondary to blood loss (chronic): Secondary | ICD-10-CM

## 2023-01-03 DIAGNOSIS — Z800 Family history of malignant neoplasm of digestive organs: Secondary | ICD-10-CM | POA: Diagnosis not present

## 2023-01-03 DIAGNOSIS — Z8616 Personal history of COVID-19: Secondary | ICD-10-CM | POA: Insufficient documentation

## 2023-01-03 DIAGNOSIS — Z79899 Other long term (current) drug therapy: Secondary | ICD-10-CM | POA: Diagnosis not present

## 2023-01-03 DIAGNOSIS — Z9079 Acquired absence of other genital organ(s): Secondary | ICD-10-CM | POA: Diagnosis not present

## 2023-01-03 DIAGNOSIS — Z808 Family history of malignant neoplasm of other organs or systems: Secondary | ICD-10-CM | POA: Insufficient documentation

## 2023-01-03 DIAGNOSIS — Z8042 Family history of malignant neoplasm of prostate: Secondary | ICD-10-CM | POA: Insufficient documentation

## 2023-01-03 LAB — CBC WITH DIFFERENTIAL (CANCER CENTER ONLY)
Abs Immature Granulocytes: 0.03 10*3/uL (ref 0.00–0.07)
Basophils Absolute: 0.1 10*3/uL (ref 0.0–0.1)
Basophils Relative: 1 %
Eosinophils Absolute: 0.3 10*3/uL (ref 0.0–0.5)
Eosinophils Relative: 3 %
HCT: 41.1 % (ref 36.0–46.0)
Hemoglobin: 13.4 g/dL (ref 12.0–15.0)
Immature Granulocytes: 0 %
Lymphocytes Relative: 27 %
Lymphs Abs: 2.8 10*3/uL (ref 0.7–4.0)
MCH: 26.7 pg (ref 26.0–34.0)
MCHC: 32.6 g/dL (ref 30.0–36.0)
MCV: 82 fL (ref 80.0–100.0)
Monocytes Absolute: 0.6 10*3/uL (ref 0.1–1.0)
Monocytes Relative: 6 %
Neutro Abs: 6.6 10*3/uL (ref 1.7–7.7)
Neutrophils Relative %: 63 %
Platelet Count: 336 10*3/uL (ref 150–400)
RBC: 5.01 MIL/uL (ref 3.87–5.11)
RDW: 15.1 % (ref 11.5–15.5)
WBC Count: 10.4 10*3/uL (ref 4.0–10.5)
nRBC: 0 % (ref 0.0–0.2)

## 2023-01-03 LAB — IRON AND IRON BINDING CAPACITY (CC-WL,HP ONLY)
Iron: 88 ug/dL (ref 28–170)
Saturation Ratios: 22 % (ref 10.4–31.8)
TIBC: 406 ug/dL (ref 250–450)
UIBC: 318 ug/dL (ref 148–442)

## 2023-01-03 LAB — FERRITIN: Ferritin: 20 ng/mL (ref 11–307)

## 2023-01-03 NOTE — Progress Notes (Signed)
[facility] [phone]   [phone]) [phone]  PROGRESS NOTE  Patient Care Team: Deeann Saint, MD as PCP - General (Family Medicine)  Hematological/Oncological History 1) Labs from OB/GYN: -11/28/2021: WBC 9.0, Hgb 7.3, MCV 62, Plt 495  2) 01/11/2022: Establish care with [facility] with Dr. Jeanie Sewer and Georga Kaufmann PA-C  3) 01/22/2022-01/30/2022: Received IV feraheme 510 mg x 2 doses  4) 04/08/2022-04/15/2022: Received IV feraheme 510 mg x 2 doses  5) 07/26/2022-08/02/2022: Received IV feraheme 510 mg x 2 doses  6) 09/20/2022-09/27/2022: Received IV feraheme 510 mg x 2 doses  7) 11/22/2022: Received IV feraheme 510 mg x 1 dose.  8) 11/29/2022: Underwent robotic total hysterectomy, bilateral salpingectomy, lysis of adhesions  CHIEF COMPLAINTS/PURPOSE OF CONSULTATION:  "Iron deficiency anemia "  HISTORY OF PRESENTING ILLNESS:  Melanie [name] 42 y.o. female returns for a follow up for iron deficiency anemia. She was seen last on 10/11/2022. In the interim, she received IV feraheme infusion x 1 dose and underwent total hysterectomy. She is unaccompanied for this visit.   On exam today, Ms. Stocum reports she is feeling well since undergoing surgery last month. Her energy levels are improving. He temporarily stopped her iron pills as it was causing constipation after surgery. She denies any signs of bleeding. She denies nausea, vomiting or abdominal pain.  She denies fevers, chills, sweats, shortness of breath, chest pain, cough, headaches, dizziness or syncopal episodes. She has no other complaints. Rest of the 10 point ROS is below.   MEDICAL HISTORY:  Past Medical History:  Diagnosis Date   Anemia    Patient follows w/ Carlyn Reichert, PA @ [facility]. She is s/p iron infusions.   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 cm   Fibroid    Hx of varicella    Microcytic anemia    Vaginal Pap smear, abnormal     hpv per patient   Vitamin B12 deficiency    Vitamin D deficiency    taking weekly Vitamin D   Wears contact lenses    Wears glasses     SURGICAL HISTORY: Past Surgical History:  Procedure Laterality Date   CESAREAN SECTION N/A 11/23/2015   Procedure: Primary CESAREAN SECTION;  Surgeon: Maxie Better, MD;  [facility];  Service: Obstetrics;  Laterality: N/A;  EDD: 12/13/15   LAPAROTOMY N/A 12/04/2013   Procedure: Exploratory Laparotomy with Evacuation Hematoma ;  Surgeon: Serita Kyle, MD;  Location: [facility];  Service: Gynecology;  Laterality: N/A;   MYOMECTOMY N/A 12/03/2013   Procedure: Exploratory Laparotomy MYOMECTOMY;  Surgeon: Serita Kyle, MD;  Location: [facility];  Service: Gynecology;  Laterality: N/A;   RADIOACTIVE SEED GUIDED EXCISIONAL BREAST BIOPSY Right 02/07/2022   Procedure: RADIOACTIVE SEED GUIDED EXCISIONAL RIGHT BREAST BIOPSY;  Surgeon: Emelia Loron, MD;  Location: [facility];  Service: General;  Laterality: Right;   ROBOTIC ASSISTED LAPAROSCOPIC HYSTERECTOMY AND SALPINGECTOMY N/A 11/29/2022   Procedure: XI ROBOTIC ASSISTED LAPAROSCOPIC HYSTERECTOMY AND SALPINGECTOMY;  Surgeon: Maxie Better, MD;  Location: [facility];  Service: Gynecology;  Laterality: N/A;    SOCIAL HISTORY: Social History   Socioeconomic History   Marital status: Married    Spouse name: Not on file   Number of children: Not on file   Years of education: Not on file   Highest education level: Not on file  Occupational History   Not on file  Tobacco Use  Smoking status: Never   Smokeless tobacco: Never  Vaping Use   Vaping status: Never Used  Substance and Sexual Activity   Alcohol use: Yes    Alcohol/week: 2.0 standard drinks of alcohol    Types: 1 Glasses of wine, 1 Shots of liquor per week    Comment: socially   Drug use: No   Sexual activity: Yes    Birth control/protection: None  Other Topics Concern    Not on file  Social History Narrative   Not on file   Social Determinants of Health   Financial Resource Strain: Not on file  Food Insecurity: Not on file  Transportation Needs: Not on file  Physical Activity: Not on file  Stress: Not on file  Social Connections: Not on file  Intimate Partner Violence: Not on file    FAMILY HISTORY: Family History  Problem Relation Age of Onset   Cancer Father        prostate   Diabetes Father    Heart disease Father    Multiple myeloma Father    Cancer Paternal Grandmother        colon   Diabetes Paternal Grandmother     ALLERGIES:  is allergic to latex and nickel.  MEDICATIONS:  Current Outpatient Medications  Medication Sig Dispense Refill   bisacodyl (DULCOLAX) 5 MG EC tablet Take 5 mg by mouth daily as needed for moderate constipation.     ibuprofen (ADVIL) 600 MG tablet Take 1 tablet (600 mg total) by mouth 4 (four) times daily. 30 tablet 11   ondansetron (ZOFRAN) 4 MG tablet Take 1 tablet (4 mg total) by mouth every 8 (eight) hours as needed for nausea. 20 tablet 0   Vitamin D, Ergocalciferol, (DRISDOL) 1.25 MG (50000 UNIT) CAPS capsule Take 1 capsule (50,000 Units total) by mouth every 7 (seven) days. 12 capsule 0   No current facility-administered medications for this visit.   Facility-Administered Medications Ordered in Other Visits  Medication Dose Route Frequency Provider Last Rate Last Admin   triamcinolone acetonide (KENALOG-40) injection 40 mg  40 mg Intramuscular Once Emelia Loron, MD        REVIEW OF SYSTEMS:   Constitutional: ( - ) fevers, ( - )  chills , ( - ) night sweats Eyes: ( - ) blurriness of vision, ( - ) double vision, ( - ) watery eyes Ears, nose, mouth, throat, and face: ( - ) mucositis, ( - ) sore throat Respiratory: ( - ) cough, ( - ) dyspnea, ( - ) wheezes Cardiovascular: ( - ) palpitation, ( - ) chest discomfort, ( - ) lower extremity swelling Gastrointestinal:  ( - ) nausea, ( - )  heartburn, ( - ) change in bowel habits Skin: ( - ) abnormal skin rashes Lymphatics: ( - ) new lymphadenopathy, ( - ) easy bruising Neurological: ( - ) numbness, ( - ) tingling, ( - ) new weaknesses Behavioral/Psych: ( - ) mood change, ( - ) new changes  All other systems were reviewed with the patient and are negative.  PHYSICAL EXAMINATION: ECOG PERFORMANCE STATUS: 0 - Asymptomatic  Vitals:   01/03/23 1303  BP: 135/70  Pulse: 64  Resp: 13  Temp: (!) 100.4 F (38 C)  SpO2: 100%   Filed Weights   01/03/23 1303  Weight: 245 lb 11.2 oz (111.4 kg)    GENERAL: well appearing female in NAD  SKIN: skin color, texture, turgor are normal, no rashes or significant lesions EYES: conjunctiva are pink  and non-injected, sclera clear LUNGS: clear to auscultation and percussion with normal breathing effort HEART: regular rate & rhythm and no murmurs and no lower extremity edema Musculoskeletal: no cyanosis of digits and no clubbing  PSYCH: alert & oriented x 3, fluent speech NEURO: no focal motor/sensory deficits  LABORATORY DATA:  I have reviewed the data as listed    Latest Ref Rng & Units 01/03/2023   12:42 PM 11/29/2022    5:11 PM 11/27/2022    1:12 PM  CBC  WBC 4.0 - 10.5 K/uL 10.4  13.0  8.7   Hemoglobin 12.0 - 15.0 g/dL 13.0  86.5  78.4   Hematocrit 36.0 - 46.0 % 41.1  38.3  34.2   Platelets 150 - 400 K/uL 336  417  415        Latest Ref Rng & Units 11/29/2022    5:11 PM 11/29/2022    6:07 AM 10/11/2022    1:08 PM  CMP  Glucose 70 - 99 mg/dL 696  295  64   BUN 6 - 20 mg/dL 8  8  11    Creatinine 0.44 - 1.00 mg/dL 2.84  1.32  4.40   Sodium 135 - 145 mmol/L 135  135  137   Potassium 3.5 - 5.1 mmol/L 3.5  3.0  3.8   Chloride 98 - 111 mmol/L 100  100  104   CO2 22 - 32 mmol/L 25  25  26    Calcium 8.9 - 10.3 mg/dL 9.2  9.0  9.7   Total Protein 6.5 - 8.1 g/dL   7.7   Total Bilirubin 0.3 - 1.2 mg/dL   0.4   Alkaline Phos 38 - 126 U/L   69   AST 15 - 41 U/L   16   ALT 0 - 44  U/L   19    RADIOGRAPHIC STUDIES: I have personally reviewed the radiological images as listed and agreed with the findings in the report. No results found.  ASSESSMENT & PLAN BLENDA RAWLES is a 42 y.o. female returns for a follow up for iron deficiency anemia.  #Iron deficiency anemia 2/2 heavy menstrual bleeding --Most recently received IV feraheme 510 mg on 11/22/2022 --Underwent robotic total hysterectomy, bilateral salpingectomy, lysis of adhesions on 11/29/2022 --Labs from today show anemia has resolved with Hgb 13.4, MCV 82.0. Iron panel shows iron 88, TIBC 406, saturation 22%, Ferritin 20 --no need for additional IV iron at this time.  --continue with ferrous sulfate 325 mg once daily with a source of vitamin C.  --RTC in 3 months with labs and 6 months with labs/follow up  No orders of the defined types were placed in this encounter.   All questions were answered. The patient knows to call the clinic with any problems, questions or concerns.  I have spent a total of 25 minutes minutes of face-to-face and non-face-to-face time, preparing to see the patient, performing a medically appropriate examination, counseling and educating the patient, documenting clinical information in the electronic health record,   and care coordination.   Georga Kaufmann, PA-C Department of Hematology/Oncology [facility] at [facility] Phone: [phone]

## 2023-03-25 ENCOUNTER — Inpatient Hospital Stay: Payer: Federal, State, Local not specified - PPO | Attending: Physician Assistant

## 2023-03-25 DIAGNOSIS — Z79899 Other long term (current) drug therapy: Secondary | ICD-10-CM | POA: Diagnosis not present

## 2023-03-25 DIAGNOSIS — D509 Iron deficiency anemia, unspecified: Secondary | ICD-10-CM | POA: Insufficient documentation

## 2023-03-25 DIAGNOSIS — D500 Iron deficiency anemia secondary to blood loss (chronic): Secondary | ICD-10-CM

## 2023-03-25 LAB — CBC WITH DIFFERENTIAL (CANCER CENTER ONLY)
Abs Immature Granulocytes: 0.02 10*3/uL (ref 0.00–0.07)
Basophils Absolute: 0.1 10*3/uL (ref 0.0–0.1)
Basophils Relative: 1 %
Eosinophils Absolute: 0.1 10*3/uL (ref 0.0–0.5)
Eosinophils Relative: 1 %
HCT: 41.1 % (ref 36.0–46.0)
Hemoglobin: 13.7 g/dL (ref 12.0–15.0)
Immature Granulocytes: 0 %
Lymphocytes Relative: 29 %
Lymphs Abs: 2.7 10*3/uL (ref 0.7–4.0)
MCH: 27 pg (ref 26.0–34.0)
MCHC: 33.3 g/dL (ref 30.0–36.0)
MCV: 80.9 fL (ref 80.0–100.0)
Monocytes Absolute: 0.5 10*3/uL (ref 0.1–1.0)
Monocytes Relative: 6 %
Neutro Abs: 5.8 10*3/uL (ref 1.7–7.7)
Neutrophils Relative %: 63 %
Platelet Count: 349 10*3/uL (ref 150–400)
RBC: 5.08 MIL/uL (ref 3.87–5.11)
RDW: 14.3 % (ref 11.5–15.5)
WBC Count: 9.2 10*3/uL (ref 4.0–10.5)
nRBC: 0 % (ref 0.0–0.2)

## 2023-03-25 LAB — IRON AND IRON BINDING CAPACITY (CC-WL,HP ONLY)
Iron: 60 ug/dL (ref 28–170)
Saturation Ratios: 13 % (ref 10.4–31.8)
TIBC: 452 ug/dL — ABNORMAL HIGH (ref 250–450)
UIBC: 392 ug/dL (ref 148–442)

## 2023-03-25 LAB — FERRITIN: Ferritin: 16 ng/mL (ref 11–307)

## 2023-03-27 ENCOUNTER — Telehealth: Payer: Self-pay

## 2023-03-27 ENCOUNTER — Other Ambulatory Visit: Payer: Self-pay | Admitting: Physician Assistant

## 2023-03-27 NOTE — Telephone Encounter (Signed)
-----   Message from Briant Cedar sent at 03/27/2023 11:28 AM EDT ----- Please notify patient that iron has dropped slightly but she is not anemic. We will arrange for IV iron x 1 dose to boost levels. ----- Message ----- From: Leory Plowman, Lab In [place] Sent: 03/25/2023   2:22 PM EDT To: Briant Cedar, PA-C

## 2023-03-27 NOTE — Telephone Encounter (Signed)
Dr. Mariane Baumgarten, the patient will be scheduled as soon as possible.  Auth Submission: NO AUTH NEEDED Site of care: Site of care: [facility] Payer: [employer] Medication & CPT/J Code(s) submitted: Feraheme (ferumoxytol) [id number] Route of submission (phone, fax, portal):  Phone # Fax # Auth type: Buy/Bill PB Units/visits requested: 510mg  x 1 dose Reference number:  Approval from: 03/27/23 to 06/10/23

## 2023-03-27 NOTE — Telephone Encounter (Signed)
Pt advised of lab results and the need for IV iron.  She said she has not been taking any oral iron medication and wants to try that before she schedules an IV iron appt

## 2023-06-19 ENCOUNTER — Telehealth: Payer: Self-pay | Admitting: Hematology and Oncology

## 2023-06-20 ENCOUNTER — Inpatient Hospital Stay: Payer: Federal, State, Local not specified - PPO

## 2023-06-20 ENCOUNTER — Inpatient Hospital Stay: Payer: Federal, State, Local not specified - PPO | Admitting: Hematology and Oncology

## 2023-06-27 ENCOUNTER — Inpatient Hospital Stay: Payer: Federal, State, Local not specified - PPO | Admitting: Hematology and Oncology

## 2023-06-27 ENCOUNTER — Inpatient Hospital Stay: Payer: Federal, State, Local not specified - PPO | Attending: Physician Assistant

## 2023-06-27 VITALS — BP 114/79 | HR 79 | Temp 99.2°F | Resp 16 | Wt 254.6 lb

## 2023-06-27 DIAGNOSIS — Z833 Family history of diabetes mellitus: Secondary | ICD-10-CM | POA: Diagnosis not present

## 2023-06-27 DIAGNOSIS — Z8616 Personal history of COVID-19: Secondary | ICD-10-CM | POA: Diagnosis not present

## 2023-06-27 DIAGNOSIS — D509 Iron deficiency anemia, unspecified: Secondary | ICD-10-CM

## 2023-06-27 DIAGNOSIS — D500 Iron deficiency anemia secondary to blood loss (chronic): Secondary | ICD-10-CM | POA: Insufficient documentation

## 2023-06-27 DIAGNOSIS — Z8249 Family history of ischemic heart disease and other diseases of the circulatory system: Secondary | ICD-10-CM | POA: Insufficient documentation

## 2023-06-27 DIAGNOSIS — Z8042 Family history of malignant neoplasm of prostate: Secondary | ICD-10-CM | POA: Diagnosis not present

## 2023-06-27 DIAGNOSIS — Z800 Family history of malignant neoplasm of digestive organs: Secondary | ICD-10-CM | POA: Insufficient documentation

## 2023-06-27 DIAGNOSIS — K5900 Constipation, unspecified: Secondary | ICD-10-CM | POA: Diagnosis not present

## 2023-06-27 DIAGNOSIS — N920 Excessive and frequent menstruation with regular cycle: Secondary | ICD-10-CM | POA: Insufficient documentation

## 2023-06-27 DIAGNOSIS — Z9079 Acquired absence of other genital organ(s): Secondary | ICD-10-CM | POA: Insufficient documentation

## 2023-06-27 DIAGNOSIS — Z90710 Acquired absence of both cervix and uterus: Secondary | ICD-10-CM | POA: Diagnosis not present

## 2023-06-27 DIAGNOSIS — Z79899 Other long term (current) drug therapy: Secondary | ICD-10-CM | POA: Insufficient documentation

## 2023-06-27 LAB — CBC WITH DIFFERENTIAL (CANCER CENTER ONLY)
Abs Immature Granulocytes: 0.02 10*3/uL (ref 0.00–0.07)
Basophils Absolute: 0 10*3/uL (ref 0.0–0.1)
Basophils Relative: 0 %
Eosinophils Absolute: 0.1 10*3/uL (ref 0.0–0.5)
Eosinophils Relative: 1 %
HCT: 41.8 % (ref 36.0–46.0)
Hemoglobin: 13.6 g/dL (ref 12.0–15.0)
Immature Granulocytes: 0 %
Lymphocytes Relative: 30 %
Lymphs Abs: 2.6 10*3/uL (ref 0.7–4.0)
MCH: 26.7 pg (ref 26.0–34.0)
MCHC: 32.5 g/dL (ref 30.0–36.0)
MCV: 82 fL (ref 80.0–100.0)
Monocytes Absolute: 0.6 10*3/uL (ref 0.1–1.0)
Monocytes Relative: 7 %
Neutro Abs: 5.3 10*3/uL (ref 1.7–7.7)
Neutrophils Relative %: 62 %
Platelet Count: 305 10*3/uL (ref 150–400)
RBC: 5.1 MIL/uL (ref 3.87–5.11)
RDW: 13.5 % (ref 11.5–15.5)
WBC Count: 8.5 10*3/uL (ref 4.0–10.5)
nRBC: 0 % (ref 0.0–0.2)

## 2023-06-27 LAB — IRON AND IRON BINDING CAPACITY (CC-WL,HP ONLY)
Iron: 118 ug/dL (ref 28–170)
Saturation Ratios: 27 % (ref 10.4–31.8)
TIBC: 437 ug/dL (ref 250–450)
UIBC: 319 ug/dL (ref 148–442)

## 2023-06-27 LAB — FERRITIN: Ferritin: 16 ng/mL (ref 11–307)

## 2023-06-27 NOTE — Progress Notes (Signed)
[facility] [phone]   Fax:(336) [phone]  PROGRESS NOTE  Patient Care Team: Deeann Saint, MD as PCP - General (Family Medicine)  Hematological/Oncological History 1) Labs from OB/GYN: -11/28/2021: WBC 9.0, Hgb 7.3, MCV 62, Plt 495  2) 01/11/2022: Establish care with [facility] with Dr. Jeanie Sewer and Georga Kaufmann PA-C  3) 01/22/2022-01/30/2022: Received IV feraheme 510 mg x 2 doses  4) 04/08/2022-04/15/2022: Received IV feraheme 510 mg x 2 doses  5) 07/26/2022-08/02/2022: Received IV feraheme 510 mg x 2 doses  6) 09/20/2022-09/27/2022: Received IV feraheme 510 mg x 2 doses  7) 11/22/2022: Received IV feraheme 510 mg x 1 dose.  8) 11/29/2022: Underwent robotic total hysterectomy, bilateral salpingectomy, lysis of adhesions  CHIEF COMPLAINTS/PURPOSE OF CONSULTATION:  "Iron deficiency anemia "  HISTORY OF PRESENTING ILLNESS:  Melanie [name] 43 y.o. female returns for a follow up for iron deficiency anemia. She was seen last on 01/03/2023. In the interim, she has had no major changes in her health.  On exam today, Melanie [name] reports she has been well since we last talked 3 months ago.  She reports that she stopped taking iron pills because was causing severe constipation.  She has been taking it sparingly with the last dose last week.  She reports that she does her best to try to eat iron rich food including spinach, broccoli, and kale.  She notes that she does not eat much in the way of red meat.  She reports that she has not had no overt signs of bleeding, bruising, or dark stools.  She denies any blood in the urine or stool.  She reports her energy levels have been good and she feels that she is "occasionally tired but seems normal".  Her energy today is about an 8 out of 10.  She is not having any lightheadedness, dizziness, shortness of breath.  She has had no other major changes in her health in the interim since our last discussion.  She denies  fevers, chills, sweats, shortness of breath, chest pain, cough, headaches, dizziness or syncopal episodes. She has no other complaints. Rest of the 10 point ROS is below.   MEDICAL HISTORY:  Past Medical History:  Diagnosis Date   Anemia    Patient follows w/ Carlyn Reichert, PA @ [facility]. She is s/p iron infusions.   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 cm   Fibroid    Hx of varicella    Microcytic anemia    Vaginal Pap smear, abnormal    hpv per patient   Vitamin B12 deficiency    Vitamin D deficiency    taking weekly Vitamin D   Wears contact lenses    Wears glasses     SURGICAL HISTORY: Past Surgical History:  Procedure Laterality Date   CESAREAN SECTION N/A 11/23/2015   Procedure: Primary CESAREAN SECTION;  Surgeon: Maxie Better, MD;  [facility];  Service: Obstetrics;  Laterality: N/A;  EDD: 12/13/15   LAPAROTOMY N/A 12/04/2013   Procedure: Exploratory Laparotomy with Evacuation Hematoma ;  Surgeon: Serita Kyle, MD;  Location: [facility];  Service: Gynecology;  Laterality: N/A;   MYOMECTOMY N/A 12/03/2013   Procedure: Exploratory Laparotomy MYOMECTOMY;  Surgeon: Serita Kyle, MD;  Location: [facility];  Service: Gynecology;  Laterality: N/A;   RADIOACTIVE SEED GUIDED EXCISIONAL BREAST BIOPSY Right 02/07/2022   Procedure: RADIOACTIVE SEED GUIDED EXCISIONAL RIGHT BREAST BIOPSY;  Surgeon: Dwain Sarna,  Molli Hazard, MD;  Location: [facility];  Service: General;  Laterality: Right;   ROBOTIC ASSISTED LAPAROSCOPIC HYSTERECTOMY AND SALPINGECTOMY N/A 11/29/2022   Procedure: XI ROBOTIC ASSISTED LAPAROSCOPIC HYSTERECTOMY AND SALPINGECTOMY;  Surgeon: Maxie Better, MD;  Location: [facility] [place];  Service: Gynecology;  Laterality: N/A;    SOCIAL HISTORY: Social History   Socioeconomic History   Marital status: Married    Spouse name: Not on file   Number of children: Not on file    Years of education: Not on file   Highest education level: Not on file  Occupational History   Not on file  Tobacco Use   Smoking status: Never   Smokeless tobacco: Never  Vaping Use   Vaping status: Never Used  Substance and Sexual Activity   Alcohol use: Yes    Alcohol/week: 2.0 standard drinks of alcohol    Types: 1 Glasses of wine, 1 Shots of liquor per week    Comment: socially   Drug use: No   Sexual activity: Yes    Birth control/protection: None  Other Topics Concern   Not on file  Social History Narrative   Not on file   Social Drivers of Health   Financial Resource Strain: Not on file  Food Insecurity: Not on file  Transportation Needs: Not on file  Physical Activity: Not on file  Stress: Not on file  Social Connections: Not on file  Intimate Partner Violence: Not on file    FAMILY HISTORY: Family History  Problem Relation Age of Onset   Cancer Father        prostate   Diabetes Father    Heart disease Father    Multiple myeloma Father    Cancer Paternal Grandmother        colon   Diabetes Paternal Grandmother     ALLERGIES:  is allergic to latex and nickel.  MEDICATIONS:  Current Outpatient Medications  Medication Sig Dispense Refill   bisacodyl (DULCOLAX) 5 MG EC tablet Take 5 mg by mouth daily as needed for moderate constipation.     Vitamin D, Ergocalciferol, (DRISDOL) 1.25 MG (50000 UNIT) CAPS capsule Take 1 capsule (50,000 Units total) by mouth every 7 (seven) days. 12 capsule 0   No current facility-administered medications for this visit.   Facility-Administered Medications Ordered in Other Visits  Medication Dose Route Frequency Provider Last Rate Last Admin   triamcinolone acetonide (KENALOG-40) injection 40 mg  40 mg Intramuscular Once Emelia Loron, MD        REVIEW OF SYSTEMS:   Constitutional: ( - ) fevers, ( - )  chills , ( - ) night sweats Eyes: ( - ) blurriness of vision, ( - ) double vision, ( - ) watery eyes Ears, nose,  mouth, throat, and face: ( - ) mucositis, ( - ) sore throat Respiratory: ( - ) cough, ( - ) dyspnea, ( - ) wheezes Cardiovascular: ( - ) palpitation, ( - ) chest discomfort, ( - ) lower extremity swelling Gastrointestinal:  ( - ) nausea, ( - ) heartburn, ( - ) change in bowel habits Skin: ( - ) abnormal skin rashes Lymphatics: ( - ) new lymphadenopathy, ( - ) easy bruising Neurological: ( - ) numbness, ( - ) tingling, ( - ) new weaknesses Behavioral/Psych: ( - ) mood change, ( - ) new changes  All other systems were reviewed with the patient and are negative.  PHYSICAL EXAMINATION: ECOG PERFORMANCE STATUS: 0 - Asymptomatic  Vitals:  06/27/23 1042  BP: 114/79  Pulse: 79  Resp: 16  Temp: 99.2 F (37.3 C)  SpO2: 98%    Filed Weights   06/27/23 1042  Weight: 254 lb 9.6 oz (115.5 kg)     GENERAL: well appearing female in NAD  SKIN: skin color, texture, turgor are normal, no rashes or significant lesions EYES: conjunctiva are pink and non-injected, sclera clear LUNGS: clear to auscultation and percussion with normal breathing effort HEART: regular rate & rhythm and no murmurs and no lower extremity edema Musculoskeletal: no cyanosis of digits and no clubbing  PSYCH: alert & oriented x 3, fluent speech NEURO: no focal motor/sensory deficits  LABORATORY DATA:  I have reviewed the data as listed    Latest Ref Rng & Units 06/27/2023   10:26 AM 03/25/2023    2:14 PM 01/03/2023   12:42 PM  CBC  WBC 4.0 - 10.5 K/uL 8.5  9.2  10.4   Hemoglobin 12.0 - 15.0 g/dL 24.4  01.0  27.2   Hematocrit 36.0 - 46.0 % 41.8  41.1  41.1   Platelets 150 - 400 K/uL 305  349  336        Latest Ref Rng & Units 11/29/2022    5:11 PM 11/29/2022    6:07 AM 10/11/2022    1:08 PM  CMP  Glucose 70 - 99 mg/dL 536  644  64   BUN 6 - 20 mg/dL 8  8  11    Creatinine 0.44 - 1.00 mg/dL 0.34  7.42  5.95   Sodium 135 - 145 mmol/L 135  135  137   Potassium 3.5 - 5.1 mmol/L 3.5  3.0  3.8   Chloride 98 - 111  mmol/L 100  100  104   CO2 22 - 32 mmol/L 25  25  26    Calcium 8.9 - 10.3 mg/dL 9.2  9.0  9.7   Total Protein 6.5 - 8.1 g/dL   7.7   Total Bilirubin 0.3 - 1.2 mg/dL   0.4   Alkaline Phos 38 - 126 U/L   69   AST 15 - 41 U/L   16   ALT 0 - 44 U/L   19    RADIOGRAPHIC STUDIES: I have personally reviewed the radiological images as listed and agreed with the findings in the report. No results found.  ASSESSMENT & PLAN Melanie [name] is a 43 y.o. female returns for a follow up for iron deficiency anemia.  #Iron deficiency anemia 2/2 heavy menstrual bleeding --Most recently received IV feraheme 510 mg on 11/22/2022 --Underwent robotic total hysterectomy, bilateral salpingectomy, lysis of adhesions on 11/29/2022 --Labs from today show white blood cell 8.5, hemoglobin 13.6, MCV 82, platelets 305 -- Await the results of her final iron levels to determine if additional IV iron at this time.  --continue with ferrous sulfate 325 mg once daily with a source of vitamin C as tolerated.  She does not take this often due to constipation. --RTC in 6 months with labs/follow up or sooner if IV iron therapy is required.  No orders of the defined types were placed in this encounter.   All questions were answered. The patient knows to call the clinic with any problems, questions or concerns.  I have spent a total of 25 minutes minutes of face-to-face and non-face-to-face time, preparing to see the patient, performing a medically appropriate examination, counseling and educating the patient, documenting clinical information in the electronic health record,   and care coordination.  Ulysees Barns, MD Department of Hematology/Oncology [facility] at [facility] Phone: [phone] Pager: [phone] Email: [email or web address]

## 2023-06-30 ENCOUNTER — Telehealth: Payer: Self-pay

## 2023-06-30 NOTE — Telephone Encounter (Signed)
-----   Message from Pollyann Samples sent at 06/29/2023 11:08 AM EST ----- This patient saw Dr. Leonides Schanz on Friday, iron panel and hgb all normal, IDA resolved. Since ferritin is still in low-normal range, I would encourage her to take oral iron daily with vit C source. She can try another formulation that doesn't cause as much constipation for better tolerance.   Thanks, Clayborn Heron NP

## 2023-06-30 NOTE — Telephone Encounter (Signed)
Pt advised of lab results and recommendations.  She is going to try a different OTC oral iron that may not cause such severe constipation.

## 2023-11-18 DIAGNOSIS — Z01419 Encounter for gynecological examination (general) (routine) without abnormal findings: Secondary | ICD-10-CM | POA: Diagnosis not present

## 2023-12-23 ENCOUNTER — Telehealth: Payer: Self-pay | Admitting: Hematology and Oncology

## 2023-12-26 ENCOUNTER — Inpatient Hospital Stay: Payer: Federal, State, Local not specified - PPO

## 2023-12-26 ENCOUNTER — Inpatient Hospital Stay: Payer: Federal, State, Local not specified - PPO | Admitting: Hematology and Oncology

## 2024-01-02 DIAGNOSIS — Z1231 Encounter for screening mammogram for malignant neoplasm of breast: Secondary | ICD-10-CM | POA: Diagnosis not present

## 2024-01-16 ENCOUNTER — Other Ambulatory Visit

## 2024-01-16 ENCOUNTER — Ambulatory Visit: Admitting: Hematology and Oncology

## 2024-01-23 ENCOUNTER — Ambulatory Visit: Admitting: Family Medicine

## 2024-01-23 ENCOUNTER — Encounter: Payer: Self-pay | Admitting: Family Medicine

## 2024-01-23 VITALS — BP 126/74 | HR 91 | Temp 98.5°F | Ht 72.0 in | Wt 255.2 lb

## 2024-01-23 DIAGNOSIS — D229 Melanocytic nevi, unspecified: Secondary | ICD-10-CM

## 2024-01-23 DIAGNOSIS — R239 Unspecified skin changes: Secondary | ICD-10-CM | POA: Diagnosis not present

## 2024-01-23 DIAGNOSIS — N951 Menopausal and female climacteric states: Secondary | ICD-10-CM

## 2024-01-23 NOTE — Progress Notes (Signed)
 Established Patient Office Visit   Subjective  Patient ID: Melanie [name], female    DOB: 1980/08/27  Age: 43 y.o. MRN: [id number]  Chief Complaint  Patient presents with   Acute Visit    Perimenopause symptoms, irritable and moody, Depression, weight, lack of sleep, Vit D recheck     Pt is a 43 yo female seen for ongoing concern.  Experiencing perimenopausal symptoms including irritability, mood swings, anger, insomnia, brain fog.  Pt states her husband and therapist recommended she make an appointment to discuss.  Interested in possible options.  Denies hot flashes/night sweats.  Weight has been stable but not losing.  Exercising regularly.  S/p hysterectomy for AUB.  Still followed by heme-onc for history of anemia.  Also seen by OB/GYN.  Requesting new referral to dermatology.  Hoping to see someone closer in the area: Multiple nevi and skin changes.  Patient also looking for guidance on skin care routine.  Currently using Larach pose' products.    Patient Active Problem List   Diagnosis Date Noted   Menorrhagia with regular cycle 11/29/2022   S/P hysterectomy 11/29/2022   Iron deficiency anemia due to chronic blood loss 03/26/2022   Incisional breast wound 02/07/2022   Microcytic anemia 01/11/2022   Kyphosis of cervical region 04/09/2020   Postpartum care following cesarean delivery (11/23/15) 11/23/2015   S/P myomectomy 12/03/2013   Past Medical History:  Diagnosis Date   Anemia    Patient follows w/ Romeo Police, PA @ [facility]. She is s/p iron infusions.   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 cm   Fibroid    Hx of varicella    Microcytic anemia    Vaginal Pap smear, abnormal    hpv per patient   Vitamin B12 deficiency    Vitamin D  deficiency    taking weekly Vitamin D    Wears contact lenses    Wears glasses    Past Surgical History:  Procedure Laterality Date   CESAREAN SECTION N/A 11/23/2015   Procedure:  Primary CESAREAN SECTION;  Surgeon: Dickie Carder, MD;  [facility]: [facility];  Service: Obstetrics;  Laterality: N/A;  EDD: 12/13/15   LAPAROTOMY N/A 12/04/2013   Procedure: Exploratory Laparotomy with Evacuation Hematoma ;  Surgeon: Dickie DELENA Carder, MD;  Location: [facility];  Service: Gynecology;  Laterality: N/A;   MYOMECTOMY N/A 12/03/2013   Procedure: Exploratory Laparotomy MYOMECTOMY;  Surgeon: Dickie DELENA Carder, MD;  Location: [facility];  Service: Gynecology;  Laterality: N/A;   RADIOACTIVE SEED GUIDED EXCISIONAL BREAST BIOPSY Right 02/07/2022   Procedure: RADIOACTIVE SEED GUIDED EXCISIONAL RIGHT BREAST BIOPSY;  Surgeon: Ebbie Cough, MD;  Location:  S[facility]ERY [facility];  Service: General;  Laterality: Right;   ROBOTIC ASSISTED LAPAROSCOPIC HYSTERECTOMY AND SALPINGECTOMY N/A 11/29/2022   Procedure: XI ROBOTIC ASSISTED LAPAROSCOPIC HYSTERECTOMY AND SALPINGECTOMY;  Surgeon: Carder Dickie, MD;  Location: [facility];  Service: Gynecology;  Laterality: N/A;   Social History   Tobacco Use   Smoking status: Never   Smokeless tobacco: Never  Vaping Use   Vaping status: Never Used  Substance Use Topics   Alcohol use: Yes    Alcohol/week: 2.0 standard drinks of alcohol    Types: 1 Glasses of wine, 1 Shots of liquor per week    Comment: socially   Drug use: No   Family History  Problem Relation Age of Onset   Cancer Father        prostate  Diabetes Father    Heart disease Father    Multiple myeloma Father    Alcohol abuse Father    Cancer Paternal Grandmother        colon   Diabetes Paternal Grandmother    Cancer Mother    Allergies  Allergen Reactions   Latex Itching    Gets UTI's from latex condoms   Nickel     Skin irritation    ROS Negative unless stated above    Objective:     BP 126/74 (BP Location: Left Arm, Patient Position: Sitting, Cuff Size: Large)   Pulse 91   Temp 98.5 F (36.9 C) (Oral)   Ht 6' (1.829 m)    Wt 255 lb 3.2 oz (115.8 kg)   LMP  (LMP Unknown)   SpO2 98%   BMI 34.61 kg/m  BP Readings from Last 3 Encounters:  01/23/24 126/74  06/27/23 114/79  01/03/23 135/70   Wt Readings from Last 3 Encounters:  01/23/24 255 lb 3.2 oz (115.8 kg)  06/27/23 254 lb 9.6 oz (115.5 kg)  01/03/23 245 lb 11.2 oz (111.4 kg)      Physical Exam Constitutional:      Appearance: Normal appearance.  HENT:     Head: Normocephalic and atraumatic.     Mouth/Throat:     Mouth: Mucous membranes are moist.  Cardiovascular:     Rate and Rhythm: Normal rate.  Pulmonary:     Effort: Pulmonary effort is normal.  Skin:    General: Skin is warm and dry.  Neurological:     Mental Status: She is alert and oriented to person, place, and time. Mental status is at baseline.        01/23/2024    9:55 AM 10/01/2021    2:29 PM 10/13/2020    9:38 AM  Depression screen PHQ 2/9  Decreased Interest 0 0 0  Down, Depressed, Hopeless 1 0 0  PHQ - 2 Score 1 0 0  Altered sleeping 1 1 0  Tired, decreased energy 1 1 1   Change in appetite 0 1 1  Feeling bad or failure about yourself  0 0 0  Trouble concentrating 1 1 0  Moving slowly or fidgety/restless 0 0 0  Suicidal thoughts 0 0 0  PHQ-9 Score 4 4 2   Difficult doing work/chores Not difficult at all Not difficult at all Not difficult at all      01/23/2024    9:55 AM 10/13/2020    9:38 AM  GAD 7 : Generalized Anxiety Score  Nervous, Anxious, on Edge 0 0  Control/stop worrying 0 0  Worry too much - different things 0 0  Trouble relaxing 0 0  Restless 0 0  Easily annoyed or irritable 1 1  Afraid - awful might happen 0 0  Total GAD 7 Score 1 1  Anxiety Difficulty  Not difficult at all     No results found for any visits on 01/23/24.    Assessment & Plan:   Perimenopause  Multiple nevi -     Ambulatory referral to Dermatology  Unspecified skin changes -     Ambulatory referral to Dermatology  Discussed OTC supplements geared more towards  vasomotor symptoms.  Also discussed R/B/A of HRT and SSRIs for treatment of perimenopausal symptoms.  Consider Lexapro or low-dose estrogen.  Pt will notify clinic if would like to pursue either option.  Continue follow-up with OB/GYN.  Referral for dermatology placed.  Return for physical.   Clotilda SAUNDERS  Mercer, MD

## 2024-01-30 ENCOUNTER — Other Ambulatory Visit: Payer: Self-pay | Admitting: Hematology and Oncology

## 2024-01-30 ENCOUNTER — Inpatient Hospital Stay: Attending: Hematology and Oncology

## 2024-01-30 ENCOUNTER — Inpatient Hospital Stay: Admitting: Hematology and Oncology

## 2024-01-30 VITALS — BP 110/65 | HR 75 | Temp 97.7°F | Resp 14 | Wt 255.4 lb

## 2024-01-30 DIAGNOSIS — D500 Iron deficiency anemia secondary to blood loss (chronic): Secondary | ICD-10-CM

## 2024-01-30 DIAGNOSIS — Z8249 Family history of ischemic heart disease and other diseases of the circulatory system: Secondary | ICD-10-CM | POA: Insufficient documentation

## 2024-01-30 DIAGNOSIS — N920 Excessive and frequent menstruation with regular cycle: Secondary | ICD-10-CM | POA: Insufficient documentation

## 2024-01-30 DIAGNOSIS — K5900 Constipation, unspecified: Secondary | ICD-10-CM | POA: Insufficient documentation

## 2024-01-30 DIAGNOSIS — Z90710 Acquired absence of both cervix and uterus: Secondary | ICD-10-CM | POA: Diagnosis not present

## 2024-01-30 DIAGNOSIS — Z833 Family history of diabetes mellitus: Secondary | ICD-10-CM | POA: Diagnosis not present

## 2024-01-30 DIAGNOSIS — Z8042 Family history of malignant neoplasm of prostate: Secondary | ICD-10-CM | POA: Diagnosis not present

## 2024-01-30 DIAGNOSIS — Z79899 Other long term (current) drug therapy: Secondary | ICD-10-CM | POA: Diagnosis not present

## 2024-01-30 DIAGNOSIS — D509 Iron deficiency anemia, unspecified: Secondary | ICD-10-CM

## 2024-01-30 DIAGNOSIS — Z811 Family history of alcohol abuse and dependence: Secondary | ICD-10-CM | POA: Diagnosis not present

## 2024-01-30 DIAGNOSIS — Z8616 Personal history of COVID-19: Secondary | ICD-10-CM | POA: Insufficient documentation

## 2024-01-30 DIAGNOSIS — Z9079 Acquired absence of other genital organ(s): Secondary | ICD-10-CM | POA: Insufficient documentation

## 2024-01-30 DIAGNOSIS — Z807 Family history of other malignant neoplasms of lymphoid, hematopoietic and related tissues: Secondary | ICD-10-CM | POA: Diagnosis not present

## 2024-01-30 LAB — CBC WITH DIFFERENTIAL (CANCER CENTER ONLY)
Abs Immature Granulocytes: 0.03 K/uL (ref 0.00–0.07)
Basophils Absolute: 0.1 K/uL (ref 0.0–0.1)
Basophils Relative: 1 %
Eosinophils Absolute: 0.1 K/uL (ref 0.0–0.5)
Eosinophils Relative: 1 %
HCT: 42.4 % (ref 36.0–46.0)
Hemoglobin: 13.9 g/dL (ref 12.0–15.0)
Immature Granulocytes: 0 %
Lymphocytes Relative: 25 %
Lymphs Abs: 2.4 K/uL (ref 0.7–4.0)
MCH: 26.7 pg (ref 26.0–34.0)
MCHC: 32.8 g/dL (ref 30.0–36.0)
MCV: 81.4 fL (ref 80.0–100.0)
Monocytes Absolute: 0.6 K/uL (ref 0.1–1.0)
Monocytes Relative: 6 %
Neutro Abs: 6.5 K/uL (ref 1.7–7.7)
Neutrophils Relative %: 67 %
Platelet Count: 361 K/uL (ref 150–400)
RBC: 5.21 MIL/uL — ABNORMAL HIGH (ref 3.87–5.11)
RDW: 13.6 % (ref 11.5–15.5)
WBC Count: 9.6 K/uL (ref 4.0–10.5)
nRBC: 0 % (ref 0.0–0.2)

## 2024-01-30 LAB — CMP (CANCER CENTER ONLY)
ALT: 15 U/L (ref 0–44)
AST: 17 U/L (ref 15–41)
Albumin: 4.5 g/dL (ref 3.5–5.0)
Alkaline Phosphatase: 81 U/L (ref 38–126)
Anion gap: 5 (ref 5–15)
BUN: 10 mg/dL (ref 6–20)
CO2: 28 mmol/L (ref 22–32)
Calcium: 9.5 mg/dL (ref 8.9–10.3)
Chloride: 105 mmol/L (ref 98–111)
Creatinine: 0.65 mg/dL (ref 0.44–1.00)
GFR, Estimated: >60 mL/min (ref >60)
Glucose, Bld: 96 mg/dL (ref 70–99)
Potassium: 3.7 mmol/L (ref 3.5–5.1)
Sodium: 138 mmol/L (ref 135–145)
Total Bilirubin: 0.4 mg/dL (ref 0.0–1.2)
Total Protein: 7.3 g/dL (ref 6.5–8.1)

## 2024-01-30 LAB — IRON AND IRON BINDING CAPACITY (CC-WL,HP ONLY)
Iron: 58 ug/dL (ref 28–170)
Saturation Ratios: 14 % (ref 10.4–31.8)
TIBC: 403 ug/dL (ref 250–450)
UIBC: 345 ug/dL (ref 148–442)

## 2024-01-30 LAB — RETIC PANEL
Immature Retic Fract: 13.2 % (ref 2.3–15.9)
RBC.: 5.21 MIL/uL — ABNORMAL HIGH (ref 3.87–5.11)
Retic Count, Absolute: 68.8 K/uL (ref 19.0–186.0)
Retic Ct Pct: 1.3 % (ref 0.4–3.1)
Reticulocyte Hemoglobin: 31.9 pg (ref >27.9)

## 2024-01-30 NOTE — Progress Notes (Signed)
 [facility] [phone]   [phone]  PROGRESS NOTE  Patient Care Team: Mercer Clotilda SAUNDERS, MD as PCP - General (Family Medicine)  Hematological/Oncological History 1) Labs from OB/GYN: -11/28/2021: WBC 9.0, Hgb 7.3, MCV 62, Plt 495  2) 01/11/2022: Establish care with [facility] Hematology with Dr. Norleen Kidney and Johnston Police PA-C  3) 01/22/2022-01/30/2022: Received IV feraheme  510 mg x 2 doses  4) 04/08/2022-04/15/2022: Received IV feraheme  510 mg x 2 doses  5) 07/26/2022-08/02/2022: Received IV feraheme  510 mg x 2 doses  6) 09/20/2022-09/27/2022: Received IV feraheme  510 mg x 2 doses  7) 11/22/2022: Received IV feraheme  510 mg x 1 dose.  8) 11/29/2022: Underwent robotic total hysterectomy, bilateral salpingectomy, lysis of adhesions  CHIEF COMPLAINTS/PURPOSE OF CONSULTATION:  Iron deficiency anemia   HISTORY OF PRESENTING ILLNESS:  Melanie [name] 43 y.o. female returns for a follow up for iron deficiency anemia. She was seen last on 06/27/2023. In the interim, she has had no major changes in her health.  On exam today, Ms. Stevick reports she has been well overall since her last visit.  She reports that she is going through perimenopause and has been having mood swings as well as changes in her weight.  She reports that she is not having any lightheadedness, dizziness, or shortness of breath.  She reports her energy today is about a 7 out of 10.  She is not having any menstrual bleeding as she underwent hysterectomy 1 year ago.  She is not having any bleeding anywhere such as nosebleeds, gum bleeding, or dark stools.  She reports that when she finished her last round of iron pills she never got a refill.  She reports that she does take a multivitamin every other day.  She also does her best to try to eat iron rich foods including vegetables.  She notes that she is not having any ice or salt cravings.  Overall she feels well and has no additional questions  concerns or complaints today.  A full 10 point ROS is otherwise negative.  Today we discussed with the option patient's moving forward.  Given the fact that she is no longer having any bleeding and is not on iron pills I would recommend that she follow-up routinely with her primary care provider and see us  on an as-needed basis.  She was in agreement with this plan.  MEDICAL HISTORY:  Past Medical History:  Diagnosis Date   Anemia    Patient follows w/ Iren Thayil, PA @ [facility]. She is s/p iron infusions.   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 cm   Fibroid    Hx of varicella    Microcytic anemia    Vaginal Pap smear, abnormal    hpv per patient   Vitamin B12 deficiency    Vitamin D  deficiency    taking weekly Vitamin D    Wears contact lenses    Wears glasses     SURGICAL HISTORY: Past Surgical History:  Procedure Laterality Date   CESAREAN SECTION N/A 11/23/2015   Procedure: Primary CESAREAN SECTION;  Surgeon: Dickie Carder, MD;  [facility];  Service: Obstetrics;  Laterality: N/A;  EDD: 12/13/15   LAPAROTOMY N/A 12/04/2013   Procedure: Exploratory Laparotomy with Evacuation Hematoma ;  Surgeon: Dickie DELENA Carder, MD;  Location: [facility];  Service: Gynecology;  Laterality: N/A;   MYOMECTOMY N/A 12/03/2013   Procedure: Exploratory Laparotomy MYOMECTOMY;  Surgeon: Dickie A  Rutherford, MD;  Location: [facility];  Service: Gynecology;  Laterality: N/A;   RADIOACTIVE SEED GUIDED EXCISIONAL BREAST BIOPSY Right 02/07/2022   Procedure: RADIOACTIVE SEED GUIDED EXCISIONAL RIGHT BREAST BIOPSY;  Surgeon: Ebbie Cough, MD;  Location: [facility];  Service: General;  Laterality: Right;   ROBOTIC ASSISTED LAPAROSCOPIC HYSTERECTOMY AND SALPINGECTOMY N/A 11/29/2022   Procedure: XI ROBOTIC ASSISTED LAPAROSCOPIC HYSTERECTOMY AND SALPINGECTOMY;  Surgeon: Rutherford Gain, MD;  Location: [facility];   Service: Gynecology;  Laterality: N/A;    SOCIAL HISTORY: Social History   Socioeconomic History   Marital status: Married    Spouse name: Not on file   Number of children: Not on file   Years of education: Not on file   Highest education level: Not on file  Occupational History   Not on file  Tobacco Use   Smoking status: Never   Smokeless tobacco: Never  Vaping Use   Vaping status: Never Used  Substance and Sexual Activity   Alcohol use: Yes    Alcohol/week: 2.0 standard drinks of alcohol    Types: 1 Glasses of wine, 1 Shots of liquor per week    Comment: socially   Drug use: No   Sexual activity: Yes    Birth control/protection: None  Other Topics Concern   Not on file  Social History Narrative   Not on file   Social Drivers of Health   Financial Resource Strain: Not on file  Food Insecurity: Not on file  Transportation Needs: Not on file  Physical Activity: Not on file  Stress: Not on file  Social Connections: Not on file  Intimate Partner Violence: Not on file    FAMILY HISTORY: Family History  Problem Relation Age of Onset   Cancer Father        prostate   Diabetes Father    Heart disease Father    Multiple myeloma Father    Alcohol abuse Father    Cancer Paternal Grandmother        colon   Diabetes Paternal Grandmother    Cancer Mother     ALLERGIES:  is allergic to latex and nickel.  MEDICATIONS:  No current outpatient medications on file.   No current facility-administered medications for this visit.   Facility-Administered Medications Ordered in Other Visits  Medication Dose Route Frequency Provider Last Rate Last Admin   triamcinolone  acetonide (KENALOG -40) injection 40 mg  40 mg Intramuscular Once Ebbie Cough, MD        REVIEW OF SYSTEMS:   Constitutional: ( - ) fevers, ( - )  chills , ( - ) night sweats Eyes: ( - ) blurriness of vision, ( - ) double vision, ( - ) watery eyes Ears, nose, mouth, throat, and face: ( - )  mucositis, ( - ) sore throat Respiratory: ( - ) cough, ( - ) dyspnea, ( - ) wheezes Cardiovascular: ( - ) palpitation, ( - ) chest discomfort, ( - ) lower extremity swelling Gastrointestinal:  ( - ) nausea, ( - ) heartburn, ( - ) change in bowel habits Skin: ( - ) abnormal skin rashes Lymphatics: ( - ) new lymphadenopathy, ( - ) easy bruising Neurological: ( - ) numbness, ( - ) tingling, ( - ) new weaknesses Behavioral/Psych: ( - ) mood change, ( - ) new changes  All other systems were reviewed with the patient and are negative.  PHYSICAL EXAMINATION: ECOG PERFORMANCE STATUS: 0 - Asymptomatic  Vitals:   01/30/24 1503  BP: 110/65  Pulse: 75  Resp: 14  Temp: 97.7 F (36.5 C)  SpO2: 100%     Filed Weights   01/30/24 1503  Weight: 255 lb 6.4 oz (115.8 kg)      GENERAL: well appearing female in NAD  SKIN: skin color, texture, turgor are normal, no rashes or significant lesions EYES: conjunctiva are pink and non-injected, sclera clear LUNGS: clear to auscultation and percussion with normal breathing effort HEART: regular rate & rhythm and no murmurs and no lower extremity edema Musculoskeletal: no cyanosis of digits and no clubbing  PSYCH: alert & oriented x 3, fluent speech NEURO: no focal motor/sensory deficits  LABORATORY DATA:  I have reviewed the data as listed    Latest Ref Rng & Units 01/30/2024    2:43 PM 06/27/2023   10:26 AM 03/25/2023    2:14 PM  CBC  WBC 4.0 - 10.5 K/uL 9.6  8.5  9.2   Hemoglobin 12.0 - 15.0 g/dL 86.0  86.3  86.2   Hematocrit 36.0 - 46.0 % 42.4  41.8  41.1   Platelets 150 - 400 K/uL 361  305  349        Latest Ref Rng & Units 01/30/2024    2:43 PM 11/29/2022    5:11 PM 11/29/2022    6:07 AM  CMP  Glucose 70 - 99 mg/dL 96  878  890   BUN 6 - 20 mg/dL 10  8  8    Creatinine 0.44 - 1.00 mg/dL 9.34  9.24  9.35   Sodium 135 - 145 mmol/L 138  135  135   Potassium 3.5 - 5.1 mmol/L 3.7  3.5  3.0   Chloride 98 - 111 mmol/L 105  100  100   CO2  22 - 32 mmol/L 28  25  25    Calcium 8.9 - 10.3 mg/dL 9.5  9.2  9.0   Total Protein 6.5 - 8.1 g/dL 7.3     Total Bilirubin 0.0 - 1.2 mg/dL 0.4     Alkaline Phos 38 - 126 U/L 81     AST 15 - 41 U/L 17     ALT 0 - 44 U/L 15      RADIOGRAPHIC STUDIES: I have personally reviewed the radiological images as listed and agreed with the findings in the report. No results found.  ASSESSMENT & PLAN BERNEITA SANAGUSTIN is a 43 y.o. female returns for a follow up for iron deficiency anemia.  #Iron deficiency anemia 2/2 heavy menstrual bleeding --Most recently received IV feraheme  510 mg on 11/22/2022 --Underwent robotic total hysterectomy, bilateral salpingectomy, lysis of adhesions on 11/29/2022 --Labs from today show white blood cell 9.6, Hgb 13.9, MCV 81.4, Plt 361  -- Await the results of her final iron levels to determine if additional IV iron at this time.  -- Okay to stop taking p.o. ferrous sulfate  at this time..  She does not take this often due to constipation. --RTC on an as-needed basis  No orders of the defined types were placed in this encounter.   All questions were answered. The patient knows to call the clinic with any problems, questions or concerns.  I have spent a total of 25 minutes minutes of face-to-face and non-face-to-face time, preparing to see the patient, performing a medically appropriate examination, counseling and educating the patient, documenting clinical information in the electronic health record,   and care coordination.   Norleen IVAR Kidney, MD Department of Hematology/Oncology [facility] at  [facility] Phone: [phone] Pager: [phone] Email: [email or web address]

## 2024-02-02 LAB — FERRITIN: Ferritin: 49 ng/mL (ref 11–307)

## 2024-02-04 ENCOUNTER — Ambulatory Visit: Payer: Self-pay | Admitting: *Deleted

## 2024-02-04 NOTE — Telephone Encounter (Signed)
-----   Message from Norleen ONEIDA Kidney IV sent at 02/02/2024  6:50 PM EDT ----- Please let Melanie [name] know that her iron levels are strong.  Her ferritin is currently at 49, well above target.  She does not need any further p.o. iron therapy at this time.  Recommend that she  continue to eat iron rich foods and continue her multivitamin.  We will see her on an as-needed basis moving forward. ----- Message ----- From: Interface, Lab In [place] Sent: 01/30/2024   2:56 PM EDT To: Norleen ONEIDA Kidney [place], MD

## 2024-02-04 NOTE — Telephone Encounter (Signed)
 TCT patient regarding recent lab results. Spoke with her. Advised that her iron levels are strong.  Her ferritin is currently at 49, well above target.  She does not need any further p.o. iron therapy at this time.  Recommend that she  continue to eat iron rich foods and continue her multivitamin.  We will see her on an as-needed basis moving forward. Pt voiced understanding. No further questions or concerns.

## 2024-04-23 ENCOUNTER — Other Ambulatory Visit: Payer: Self-pay | Admitting: General Surgery

## 2024-04-23 DIAGNOSIS — Z9189 Other specified personal risk factors, not elsewhere classified: Secondary | ICD-10-CM

## 2024-04-23 DIAGNOSIS — Z808 Family history of malignant neoplasm of other organs or systems: Secondary | ICD-10-CM | POA: Diagnosis not present

## 2024-04-23 DIAGNOSIS — D241 Benign neoplasm of right breast: Secondary | ICD-10-CM | POA: Diagnosis not present

## 2024-05-30 ENCOUNTER — Other Ambulatory Visit

## 2024-06-11 ENCOUNTER — Other Ambulatory Visit

## 2024-06-23 ENCOUNTER — Inpatient Hospital Stay: Attending: Genetic Counselor | Admitting: Genetic Counselor

## 2024-06-23 ENCOUNTER — Inpatient Hospital Stay

## 2024-06-23 ENCOUNTER — Encounter: Payer: Self-pay | Admitting: Genetic Counselor

## 2024-06-23 DIAGNOSIS — Z801 Family history of malignant neoplasm of trachea, bronchus and lung: Secondary | ICD-10-CM

## 2024-06-23 DIAGNOSIS — Z8042 Family history of malignant neoplasm of prostate: Secondary | ICD-10-CM

## 2024-06-23 DIAGNOSIS — D48119 Desmoid tumor of unspecified site: Secondary | ICD-10-CM

## 2024-06-23 DIAGNOSIS — Z800 Family history of malignant neoplasm of digestive organs: Secondary | ICD-10-CM

## 2024-06-23 LAB — GENETIC SCREENING ORDER

## 2024-06-23 NOTE — Progress Notes (Addendum)
 REFERRING PROVIDER: Aron Shoulders, MD   PRIMARY PROVIDER:  Mercer Clotilda SAUNDERS, MD  PRIMARY REASON FOR VISIT:  1. Family history of malignant neoplasm of prostate   2. Family history of malignant neoplasm of gastrointestinal tract   3. Family history of lung cancer     HISTORY OF PRESENT ILLNESS:   Melanie [name], a 44 y.o. female, was seen for a [facility] cancer genetics consultation at the request of Aron Shoulders, MD due to a family history of cancer.  Melanie [name] presents to clinic today to discuss the possibility of a hereditary predisposition to cancer, to discuss genetic testing, and to further clarify her future cancer risks, as well as potential cancer risks for family members.    Melanie [name] is a 44 y.o. female with no personal history of cancer. Determined to be at increased risk for breast cancer based on a Gail model calculating lifetime breast cancer risk at 20.2% per [facility] mammography.   RELEVANT MEDICAL HISTORY:  Menarche was between the ages of [age].  First live birth at age 37.  OCP use for approximately 10-13 years.  Ovaries intact: yes.  Uterus intact: no. Hysterectomy 2024 due to menorrhagia  Menopausal status: perimenopausal  HRT use: 0 years. Colonoscopy: no; not examined. Mammogram within the last year: yes. Number of breast biopsies: 1. Low grade spindle cell proliferation consistent with fibromatosis. Lumpectomy/excisional biopsy 02/2022, detected desmoid type fibromatosis.    Past Medical History:  Diagnosis Date   Anemia    Patient follows w/ Iren Thayil, PA @ [facility]. She is s/p iron infusions.   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 cm   Fibroid    Hx of varicella    Microcytic anemia    Vaginal Pap smear, abnormal    hpv per patient   Vitamin B12 deficiency    Vitamin D  deficiency    taking weekly Vitamin D    Wears contact lenses    Wears glasses     Past Surgical History:  Procedure  Laterality Date   CESAREAN SECTION N/A 11/23/2015   Procedure: Primary CESAREAN SECTION;  Surgeon: Dickie Carder, MD;  Location: [facility];  Service: Obstetrics;  Laterality: N/A;  EDD: 12/13/15   LAPAROTOMY N/A 12/04/2013   Procedure: Exploratory Laparotomy with Evacuation Hematoma ;  Surgeon: Dickie DELENA Carder, MD;  Location: [facility];  Service: Gynecology;  Laterality: N/A;   MYOMECTOMY N/A 12/03/2013   Procedure: Exploratory Laparotomy MYOMECTOMY;  Surgeon: Dickie DELENA Carder, MD;  Location: [facility];  Service: Gynecology;  Laterality: N/A;   RADIOACTIVE SEED GUIDED EXCISIONAL BREAST BIOPSY Right 02/07/2022   Procedure: RADIOACTIVE SEED GUIDED EXCISIONAL RIGHT BREAST BIOPSY;  Surgeon: Ebbie Cough, MD;  Location: [facility];  Service: General;  Laterality: Right;   ROBOTIC ASSISTED LAPAROSCOPIC HYSTERECTOMY AND SALPINGECTOMY N/A 11/29/2022   Procedure: XI ROBOTIC ASSISTED LAPAROSCOPIC HYSTERECTOMY AND SALPINGECTOMY;  Surgeon: Carder Dickie, MD;  Location: [facility];  Service: Gynecology;  Laterality: N/A;    Social History   Socioeconomic History   Marital status: Married    Spouse name: Not on file   Number of children: Not on file   Years of education: Not on file   Highest education level: Not on file  Occupational History   Not on file  Tobacco Use   Smoking status: Never   Smokeless tobacco: Never  Vaping Use   Vaping status: Never Used  Substance and Sexual Activity  Alcohol use: Yes    Alcohol/week: 2.0 standard drinks of alcohol    Types: 1 Glasses of wine, 1 Shots of liquor per week    Comment: socially   Drug use: No   Sexual activity: Yes    Birth control/protection: None  Other Topics Concern   Not on file  Social History Narrative   Not on file   Social Drivers of Health   Tobacco Use: Low Risk  (04/23/2024)   Received from [facility]   Patient History    Smoking Tobacco Use:  Never    Smokeless Tobacco Use: Never    Passive Exposure: Not on file  Financial Resource Strain: Not on file  Food Insecurity: Not on file  Transportation Needs: Not on file  Physical Activity: Not on file  Stress: Not on file  Social Connections: Not on file  Depression (PHQ2-9): Low Risk (01/23/2024)   Depression (PHQ2-9)    PHQ-2 Score: 4  Alcohol Screen: Not on file  Housing: Unknown (04/23/2024)   Received from [facility]   Epic    Unable to Pay for Housing in the Last Year: Not on file    Number of Times Moved in the Last Year: Not on file    At any time in the past 12 months, were you homeless or living in a shelter (including now)?: No  Utilities: Not on file  Health Literacy: Not on file     FAMILY HISTORY:  We obtained a detailed, 4-generation family history.  Significant diagnoses are listed below: Family History  Problem Relation Age of Onset   Lung cancer Mother 72   Diabetes Father    Heart disease Father    Multiple myeloma Father 55   Alcohol abuse Father    Prostate cancer Father 52   Other Maternal Grandmother 52       benign brain tumor   Pancreatic cancer Maternal Grandfather    Cancer Paternal Grandmother 90       rectal or anal cancer   Diabetes Paternal Grandmother    Melanie [name] is unaware of previous family history of genetic testing for hereditary cancer risks There is no reported Ashkenazi Jewish ancestry. Reports paternal ancestry as Ghanian and maternal ancestry as African American and Native Naval Architect.      GENETIC COUNSELING ASSESSMENT: Melanie [name] is a 44 y.o. female with a family history of cancer. We discussed and recommended the following at today's visit.   DISCUSSION: We discussed that 5 - 10% of cancer is hereditary, with most cases of cancer occurring by chance or due to environmental causes. We discussed that testing is beneficial for several reasons, including knowing about other cancer risks, identifying  potential screening and risk-reduction options that may be appropriate, and to understanding if other family members could be at risk for cancer and allowing them to undergo genetic testing.  We reviewed the characteristics, features and inheritance patterns of hereditary cancer syndromes. We also discussed genetic testing, including the appropriate family members to test, the process of testing, insurance coverage and turn-around-time for results. We discussed the implications of a negative, positive, carrier and/or variant of uncertain significant result. We discussed that negative results would be uninformative given that Melanie [name] does not have a personal history of cancer.   We discussed with Melanie [name] that the family history does not meet classic insurance or NCCN criteria for genetic testing and, therefore, is not highly consistent with a familial hereditary cancer syndrome.  If genetic testing is not covered by insurance, testing is still available with self-pay of hereditary cancer panel at most $250 out of pocket.   Melanie [name] expressed interest in pursuing genetic testing. We discussed options of a hereditary cancer panel (40+ genes) and an expanded pan-cancer panel (70+ genes). Melanie [name] was informed of the benefits and limitations of each panel, including that expanded pan-cancer panels contain several genes that do not have clear management guidelines at this point in time.  We also discussed that as the number of genes included on a panel increases, the chances of variants of uncertain significance increases.  After considering the benefits and limitations of each gene panel, Melanie [name] elected to have [facility] CancerNext-Expanded +RNAinsight panel.   We discussed that some people do not want to undergo genetic testing due to fear of genetic discrimination.  A federal law called the Genetic Information Non-Discrimination Act (GINA) of 2008 helps protect individuals against genetic  discrimination based on their genetic test results.  It impacts both health insurance and employment.  With health insurance, it protects against increased premiums, being kicked off insurance or being forced to take a test in order to be insured.  For employment it protects against hiring, firing and promoting decisions based on genetic test results.  GINA does not apply to those in the [employer], those who work for companies with less than 15 employees, and new life insurance or long-term disability insurance policies.  Health status due to a cancer diagnosis is not protected under GINA.  PLAN: After considering the risks, benefits, and limitations, Melanie [name] provided informed consent to pursue genetic testing and the blood sample was sent to [facility] for analysis of the CancerNext-Expanded +RNAinsight panel. Results should be available within approximately 2-3 weeks' time, at which point they will be disclosed by telephone to Melanie [name], as will any additional recommendations warranted by these results. Melanie [name] will receive a summary of her genetic counseling visit and a copy of her results once available. This information will also be available in Epic.   Will recalculate breast cancer risk using Beatrice Harvey model with return of genetic test results.   Lastly, we encouraged Melanie [name] to remain in contact with cancer genetics annually so that we can continuously update the family history and inform her of any changes in cancer genetics and testing that may be of benefit for this family.   Melanie [name] questions were answered to her satisfaction today. Our contact information was provided should additional questions or concerns arise. Thank you for the referral and allowing us  to share in the care of your patient.   Burnard Ogren, MS, [facility] Licensed, [email or web address] phone: [phone]   60 minutes were spent on the date of the  encounter in service to the patient including preparation, face-to-face consultation, documentation and care coordination.  The patient was seen alone.  Drs. Gudena and/or Lanny were available to discuss this case as needed.  _______________________________________________________________________ For Office Staff:  Number of people involved in session: 1 Was an Intern/ student involved with case: no

## 2024-07-02 ENCOUNTER — Ambulatory Visit: Admission: RE | Admit: 2024-07-02 | Source: Ambulatory Visit

## 2024-07-02 DIAGNOSIS — Z9189 Other specified personal risk factors, not elsewhere classified: Secondary | ICD-10-CM

## 2024-07-02 MED ORDER — GADOPICLENOL 0.5 MMOL/ML IV SOLN
10.0000 mL | Freq: Once | INTRAVENOUS | Status: AC | PRN
  Administered 2024-07-02: 10 mL via INTRAVENOUS

## 2024-07-06 ENCOUNTER — Ambulatory Visit: Payer: Self-pay | Admitting: General Surgery

## 2024-07-07 ENCOUNTER — Telehealth: Payer: Self-pay | Admitting: Genetic Counselor

## 2024-07-07 NOTE — Telephone Encounter (Signed)
 LVM asking for call back to review results of genetic testing.

## 2024-07-09 ENCOUNTER — Ambulatory Visit: Payer: Self-pay | Admitting: Genetic Counselor

## 2024-07-09 DIAGNOSIS — Z1379 Encounter for other screening for genetic and chromosomal anomalies: Secondary | ICD-10-CM | POA: Insufficient documentation

## 2024-07-09 NOTE — Progress Notes (Signed)
 HPI:  Melanie [name] was previously seen in the [facility] due to a family history of cancer and concerns regarding a hereditary predisposition to cancer. Please refer to our prior cancer genetics clinic note for more information regarding our discussion, assessment and recommendations, at the time. Ms. Hunsucker recent genetic test results were disclosed to her, as were recommendations warranted by these results. These results and recommendations are discussed in more detail below.  Results were disclosed via telephone on 07/09/24.   FAMILY HISTORY:  We obtained a detailed, 4-generation family history.  Significant diagnoses are listed below: Family History  Problem Relation Age of Onset   Lung cancer Mother 42   Diabetes Father    Heart disease Father    Multiple myeloma Father 14   Alcohol abuse Father    Prostate cancer Father 24   Other Maternal Grandmother 58       benign brain tumor   Pancreatic cancer Maternal Grandfather    Cancer Paternal Grandmother 14       rectal or anal cancer   Diabetes Paternal Grandmother     Ms. Dudgeon is unaware of previous family history of genetic testing for hereditary cancer risks There is no reported Ashkenazi Jewish ancestry. Reports paternal ancestry as Ghanian and maternal ancestry as African American and Native Naval Architect.      GENETIC TEST RESULTS: Genetic testing reported out on 06/30/24 through the [facility] CancerNext-Expanded +RNAinsight panel found no pathogenic mutations. The CancerNext-Expanded gene panel offered by [facility] and includes sequencing, rearrangement, and RNA analysis for the following 77 genes: AIP, ALK, APC, ATM, AXIN2, BAP1, BARD1, BMPR1A, BRCA1, BRCA2, BRIP1, CDC73, CDH1, CDK4, CDKN1B, CDKN2A, CEBPA, CHEK2, CTNNA1, DDX41, DICER1, EGFR, EPCAM, ETV6, FH, FLCN, GATA2, GREM1, HOXB13, KIT, LZTR1, MAX, MBD4, MEN1, MET, MITF, MLH1, MSH2, MSH3, MSH6, MUTYH, NF1, NF2, NTHL1, PALB2, PDGFRA, PHOX2B, PMS2,  POLD1, POLE, POT1, PRKAR1A, PTCH1, PTEN, RAD51C, RAD51D, RB1, RET, RPS20, RUNX1, SDHA, SDHAF2, SDHB, SDHC, SDHD, SMAD4, SMARCA4, SMARCB1, SMARCE1, STK11, SUFU, TMEM127, TP53, TSC1, TSC2, VHL, WT1. The test report has been scanned into EPIC and is located under the Molecular Pathology section of the Results Review tab.  A portion of the result report is included below for reference.     We discussed with Ms. Mitter that because current genetic testing is not perfect, it is possible there may be a gene mutation in one of these genes that current testing cannot detect, but that chance is small.  We also discussed, that there could be another gene that has not yet been discovered, or that we have not yet tested, that is responsible for the cancer diagnoses in the family. It is also possible there is a hereditary cause for the cancer in the family that Ms. Boss did not inherit and therefore was not identified in her testing.  Therefore, it is important to remain in touch with cancer genetics in the future so that we can continue to offer Ms. Absher the most up to date genetic testing.   ADDITIONAL GENETIC TESTING: We discussed with Ms. Zbikowski that her genetic testing was fairly extensive.  If there are genes identified to increase cancer risk that can be analyzed in the future, we would be happy to discuss and coordinate this testing at that time.    CANCER SCREENING RECOMMENDATIONS: Ms. Jaspers test result is considered negative (normal).  This means that we have not identified a hereditary cause for her family history of cancer at this time. Most  cancers happen by chance and this negative test suggests that her family history of cancer may fall into this category.    Possible reasons for Ms. Brubacher's negative genetic test include:  1. There may be a gene mutation in one of these genes that current testing methods cannot detect but that chance is small.  2. There could be another gene that has  not yet been discovered, or that we have not yet tested, that is responsible for the cancer diagnoses in the family.  3.  There may be no hereditary risk for cancer in the family. The cancers in Ms. Harts and/or her family may be sporadic/familial or due to other genetic and environmental factors. 4. It is also possible there is a hereditary cause for the cancer in the family that Ms. Yokoyama did not inherit.  Therefore, it is recommended she continue to follow the cancer management and screening guidelines provided by her primary healthcare provider. An individual's cancer risk and medical management are not determined by genetic test results alone. Overall cancer risk assessment incorporates additional factors, including personal medical history, family history, and any available genetic information that may result in a personalized plan for cancer prevention and surveillance  The Tyrer-Cuzick model is one of multiple prediction models developed to estimate an individual's lifetime risk of developing breast cancer. The Tyrer-Cuzick model is endorsed by the [employer] (NCCN). This model includes many risk factors such as family history, endogenous estrogen exposure, and benign breast disease. The calculation is highly-dependent on the accuracy of clinical data provided by the patient and can change over time. The Tyrer-Cuzick model may be repeated to reflect new information in her personal or family history in the future.   Based on Ms. Gosa's history, a Beatrice Harvey was used to estimate her risk of developing breast cancer. This estimates her lifetime risk of developing breast cancer to be approximately 9.9%. NCCN recommends consideration of breast MRI screening as an adjunct to mammography for patients at high risk (defined as 20% or greater lifetime risk). Please note that a woman's breast cancer risk changes over time. It may increase or decrease based on age and any  changes to the personal and/or family medical history. The risks and recommendations listed above apply to this patient at this point in time. In the future, she may or may not be eligible for the same medical management strategies and, in some cases, other medical management strategies may become available to her. If she is interested in an updated breast cancer risk assessment at a later date, she can contact us .  Ms. Sferrazza may have an increased risk for breast cancer. The Alisa model has calculated her 5 year risk for breast cancer to be 0.9-1.8%. Per NCCN, women with a 5-year risk for breast cancer over 1.7% per Alisa model can consider:  Clinical encounter every 6-12 months, to begin when increased risk is identified  Annual screening mammogram with tomosynthesis  To begin when identified as being at increased risk  Consider risk reduction strategies, such as medication to decrease the risk for breast cancer  Breast awareness   Could consider referral to Breast Program to discuss options. Ms. Mcgraw declined.   RECOMMENDATIONS FOR FAMILY MEMBERS:  Individuals in this family might be at some increased risk of developing cancer, over the general population risk, simply due to the family history of cancer.  We recommended women in this family have a yearly mammogram beginning at age 71, or 11  years younger than the earliest onset of cancer, an annual clinical breast exam, and perform monthly breast self-exams. Women in this family should also have a gynecological exam as recommended by their primary provider. All family members should be referred for colonoscopy starting at age 69, or 51 years younger than the earliest onset of cancer.  FOLLOW-UP: Lastly, we discussed with Ms. Maclellan that cancer genetics is a rapidly advancing field and it is possible that new genetic tests will be appropriate for her and/or her family members in the future. We encouraged her to remain in contact with cancer  genetics on an annual basis so we can update her personal and family histories and let her know of advances in cancer genetics that may benefit this family.   Our contact number was provided. Ms. Biegler questions were answered to her satisfaction, and she knows she is welcome to call us  at anytime with additional questions or concerns.   Burnard Ogren, MS, [facility] Licensed, [email or web address] [phone]

## 2024-09-01 ENCOUNTER — Ambulatory Visit: Admitting: Dermatology
